# Patient Record
Sex: Female | Born: 1990 | Hispanic: Yes | Marital: Married | Smoking: Never smoker
Health system: Southern US, Community
[De-identification: ages and names within clinical notes are randomized; demographics above are authoritative.]

## PROBLEM LIST (undated history)

## (undated) ENCOUNTER — Inpatient Hospital Stay (HOSPITAL_COMMUNITY): Payer: Self-pay

## (undated) DIAGNOSIS — O139 Gestational [pregnancy-induced] hypertension without significant proteinuria, unspecified trimester: Secondary | ICD-10-CM

## (undated) DIAGNOSIS — D649 Anemia, unspecified: Secondary | ICD-10-CM

## (undated) DIAGNOSIS — N390 Urinary tract infection, site not specified: Secondary | ICD-10-CM

## (undated) HISTORY — PX: NO PAST SURGERIES: SHX2092

## (undated) HISTORY — DX: Anemia, unspecified: D64.9

---

## 2012-04-19 LAB — OB RESULTS CONSOLE ABO/RH: RH Type: POSITIVE

## 2012-04-19 LAB — OB RESULTS CONSOLE RPR: RPR: NONREACTIVE

## 2012-04-19 LAB — OB RESULTS CONSOLE RUBELLA ANTIBODY, IGM: Rubella: UNDETERMINED

## 2012-04-19 LAB — OB RESULTS CONSOLE HIV ANTIBODY (ROUTINE TESTING): HIV: NONREACTIVE

## 2012-04-19 LAB — OB RESULTS CONSOLE HEPATITIS B SURFACE ANTIGEN: Hepatitis B Surface Ag: NEGATIVE

## 2012-05-30 NOTE — L&D Delivery Note (Signed)
Delivery Note  SVD viable female Apgars 9,9 over 2nd degree ML lac.  Placenta delivered spontaneously intact with 3VC. Repair with 2-0 Chromic with good support and hemostasis noted and R/V exam confirms.  PH art was sent.  [place] cord blood was not done.  Mother and baby were doing well.  EBL 300cc  Bron Snellings, MD 

## 2012-12-13 ENCOUNTER — Inpatient Hospital Stay (HOSPITAL_COMMUNITY)
Admission: AD | Admit: 2012-12-13 | Discharge: 2012-12-13 | Disposition: A | Discharge status: Home / Self Care | Payer: Managed Care, Other (non HMO) | Source: Ambulatory Visit | Attending: Obstetrics and Gynecology | Admitting: Obstetrics and Gynecology

## 2012-12-13 ENCOUNTER — Inpatient Hospital Stay (HOSPITAL_COMMUNITY): Payer: Managed Care, Other (non HMO) | Admitting: Anesthesiology

## 2012-12-13 ENCOUNTER — Encounter (HOSPITAL_COMMUNITY): Payer: Self-pay | Admitting: *Deleted

## 2012-12-13 ENCOUNTER — Encounter (HOSPITAL_COMMUNITY): Payer: Self-pay | Admitting: Anesthesiology

## 2012-12-13 ENCOUNTER — Encounter (HOSPITAL_COMMUNITY): Payer: Self-pay

## 2012-12-13 ENCOUNTER — Inpatient Hospital Stay (HOSPITAL_COMMUNITY)
Admission: AD | Admit: 2012-12-13 | Discharge: 2012-12-15 | DRG: 775 | Disposition: A | Discharge status: Home / Self Care | Payer: Managed Care, Other (non HMO) | Source: Ambulatory Visit | Attending: Obstetrics and Gynecology | Admitting: Obstetrics and Gynecology

## 2012-12-13 DIAGNOSIS — O479 False labor, unspecified: Secondary | ICD-10-CM | POA: Insufficient documentation

## 2012-12-13 DIAGNOSIS — O4290 Premature rupture of membranes, unspecified as to length of time between rupture and onset of labor, unspecified weeks of gestation: Principal | ICD-10-CM | POA: Diagnosis present

## 2012-12-13 LAB — CBC
Hemoglobin: 12.1 g/dL (ref 12.0–15.0)
MCH: 29.8 pg (ref 26.0–34.0)
MCHC: 33.9 g/dL (ref 30.0–36.0)

## 2012-12-13 LAB — TYPE AND SCREEN
ABO/RH(D): O POS
Antibody Screen: NEGATIVE

## 2012-12-13 LAB — RPR: RPR Ser Ql: NONREACTIVE

## 2012-12-13 MED ORDER — LACTATED RINGERS IV SOLN
INTRAVENOUS | Status: DC
  Administered 2012-12-13: 125 mL/h via INTRAVENOUS

## 2012-12-13 MED ORDER — ZOLPIDEM TARTRATE 5 MG PO TABS: 5.0000 mg | ORAL_TABLET | Freq: Every evening | ORAL | Status: DC | PRN

## 2012-12-13 MED ORDER — CITRIC ACID-SODIUM CITRATE 334-500 MG/5ML PO SOLN: 30.0000 mL | ORAL | Status: DC | PRN

## 2012-12-13 MED ORDER — OXYTOCIN 40 UNITS IN LACTATED RINGERS INFUSION - SIMPLE MED
1.0000 m[IU]/min | INTRAVENOUS | Status: DC
  Administered 2012-12-13: 2 m[IU]/min via INTRAVENOUS
  Filled 2012-12-13: qty 1000

## 2012-12-13 MED ORDER — ACETAMINOPHEN 325 MG PO TABS: 650.0000 mg | ORAL_TABLET | ORAL | Status: DC | PRN

## 2012-12-13 MED ORDER — PRENATAL MULTIVITAMIN CH
1.0000 | ORAL_TABLET | Freq: Every day | ORAL | Status: DC
  Administered 2012-12-14: 1 via ORAL

## 2012-12-13 MED ORDER — ONDANSETRON HCL 4 MG/2ML IJ SOLN: 4.0000 mg | INTRAMUSCULAR | Status: DC | PRN

## 2012-12-13 MED ORDER — PHENYLEPHRINE 40 MCG/ML (10ML) SYRINGE FOR IV PUSH (FOR BLOOD PRESSURE SUPPORT)
80.0000 ug | PREFILLED_SYRINGE | INTRAVENOUS | Status: DC | PRN
  Filled 2012-12-13: qty 2
  Filled 2012-12-13: qty 5

## 2012-12-13 MED ORDER — TETANUS-DIPHTH-ACELL PERTUSSIS 5-2.5-18.5 LF-MCG/0.5 IM SUSP
0.5000 mL | Freq: Once | INTRAMUSCULAR | Status: AC
  Administered 2012-12-14: 0.5 mL via INTRAMUSCULAR
  Filled 2012-12-13: qty 0.5

## 2012-12-13 MED ORDER — LANOLIN HYDROUS EX OINT: TOPICAL_OINTMENT | CUTANEOUS | Status: DC | PRN

## 2012-12-13 MED ORDER — EPHEDRINE 5 MG/ML INJ
10.0000 mg | INTRAVENOUS | Status: DC | PRN
  Filled 2012-12-13: qty 2

## 2012-12-13 MED ORDER — IBUPROFEN 600 MG PO TABS: 600.0000 mg | ORAL_TABLET | Freq: Four times a day (QID) | ORAL | Status: DC | PRN

## 2012-12-13 MED ORDER — DIPHENHYDRAMINE HCL 50 MG/ML IJ SOLN: 12.5000 mg | INTRAMUSCULAR | Status: DC | PRN

## 2012-12-13 MED ORDER — MEASLES, MUMPS & RUBELLA VAC ~~LOC~~ INJ
0.5000 mL | INJECTION | Freq: Once | SUBCUTANEOUS | Status: AC
  Administered 2012-12-15: 0.5 mL via SUBCUTANEOUS
  Filled 2012-12-13: qty 0.5

## 2012-12-13 MED ORDER — LIDOCAINE HCL (PF) 1 % IJ SOLN
INTRAMUSCULAR | Status: DC | PRN
  Administered 2012-12-13: 9 mL

## 2012-12-13 MED ORDER — ONDANSETRON HCL 4 MG PO TABS: 4.0000 mg | ORAL_TABLET | ORAL | Status: DC | PRN

## 2012-12-13 MED ORDER — PRENATAL MULTIVITAMIN CH
1.0000 | ORAL_TABLET | Freq: Every day | ORAL | Status: DC
  Administered 2012-12-15: 1 via ORAL
  Filled 2012-12-13 (×2): qty 1

## 2012-12-13 MED ORDER — OXYCODONE-ACETAMINOPHEN 5-325 MG PO TABS: 1.0000 | ORAL_TABLET | ORAL | Status: DC | PRN

## 2012-12-13 MED ORDER — DIBUCAINE 1 % RE OINT: 1.0000 "application " | TOPICAL_OINTMENT | RECTAL | Status: DC | PRN

## 2012-12-13 MED ORDER — DIPHENHYDRAMINE HCL 25 MG PO CAPS: 25.0000 mg | ORAL_CAPSULE | Freq: Four times a day (QID) | ORAL | Status: DC | PRN

## 2012-12-13 MED ORDER — LACTATED RINGERS IV SOLN: 500.0000 mL | INTRAVENOUS | Status: DC | PRN

## 2012-12-13 MED ORDER — IBUPROFEN 600 MG PO TABS
600.0000 mg | ORAL_TABLET | Freq: Four times a day (QID) | ORAL | Status: DC
  Administered 2012-12-13 – 2012-12-15 (×7): 600 mg via ORAL
  Filled 2012-12-13 (×7): qty 1

## 2012-12-13 MED ORDER — OXYTOCIN 40 UNITS IN LACTATED RINGERS INFUSION - SIMPLE MED: 62.5000 mL/h | INTRAVENOUS | Status: DC

## 2012-12-13 MED ORDER — ONDANSETRON HCL 4 MG/2ML IJ SOLN: 4.0000 mg | Freq: Four times a day (QID) | INTRAMUSCULAR | Status: DC | PRN

## 2012-12-13 MED ORDER — SIMETHICONE 80 MG PO CHEW: 80.0000 mg | CHEWABLE_TABLET | ORAL | Status: DC | PRN

## 2012-12-13 MED ORDER — SENNOSIDES-DOCUSATE SODIUM 8.6-50 MG PO TABS
2.0000 | ORAL_TABLET | Freq: Every day | ORAL | Status: DC
  Administered 2012-12-13 – 2012-12-14 (×3): 2 via ORAL

## 2012-12-13 MED ORDER — WITCH HAZEL-GLYCERIN EX PADS: 1.0000 "application " | MEDICATED_PAD | CUTANEOUS | Status: DC | PRN

## 2012-12-13 MED ORDER — EPHEDRINE 5 MG/ML INJ
10.0000 mg | INTRAVENOUS | Status: DC | PRN
  Filled 2012-12-13: qty 2
  Filled 2012-12-13: qty 4

## 2012-12-13 MED ORDER — TERBUTALINE SULFATE 1 MG/ML IJ SOLN: 0.2500 mg | Freq: Once | INTRAMUSCULAR | Status: DC | PRN

## 2012-12-13 MED ORDER — BENZOCAINE-MENTHOL 20-0.5 % EX AERO
1.0000 "application " | INHALATION_SPRAY | CUTANEOUS | Status: DC | PRN
  Administered 2012-12-13: 1 via TOPICAL
  Filled 2012-12-13: qty 56

## 2012-12-13 MED ORDER — FENTANYL 2.5 MCG/ML BUPIVACAINE 1/10 % EPIDURAL INFUSION (WH - ANES)
14.0000 mL/h | INTRAMUSCULAR | Status: DC | PRN
  Filled 2012-12-13: qty 125

## 2012-12-13 MED ORDER — OXYTOCIN BOLUS FROM INFUSION: 500.0000 mL | INTRAVENOUS | Status: DC

## 2012-12-13 MED ORDER — PHENYLEPHRINE 40 MCG/ML (10ML) SYRINGE FOR IV PUSH (FOR BLOOD PRESSURE SUPPORT)
80.0000 ug | PREFILLED_SYRINGE | INTRAVENOUS | Status: DC | PRN
  Filled 2012-12-13: qty 2

## 2012-12-13 MED ORDER — LACTATED RINGERS IV SOLN
500.0000 mL | Freq: Once | INTRAVENOUS | Status: AC
  Administered 2012-12-13: 500 mL via INTRAVENOUS

## 2012-12-13 MED ORDER — LIDOCAINE HCL (PF) 1 % IJ SOLN
30.0000 mL | INTRAMUSCULAR | Status: DC | PRN
  Filled 2012-12-13 (×2): qty 30

## 2012-12-13 MED ORDER — SODIUM CHLORIDE 0.9 % IV SOLN
2.0000 g | Freq: Four times a day (QID) | INTRAVENOUS | Status: DC
  Administered 2012-12-13: 2 g via INTRAVENOUS
  Filled 2012-12-13 (×3): qty 2000

## 2012-12-13 MED ORDER — MEDROXYPROGESTERONE ACETATE 150 MG/ML IM SUSP: 150.0000 mg | INTRAMUSCULAR | Status: DC | PRN

## 2012-12-13 MED ORDER — FENTANYL 2.5 MCG/ML BUPIVACAINE 1/10 % EPIDURAL INFUSION (WH - ANES)
INTRAMUSCULAR | Status: DC | PRN
  Administered 2012-12-13: 14 mL/h via EPIDURAL

## 2012-12-13 NOTE — Anesthesia Preprocedure Evaluation (Signed)
Anesthesia Evaluation  Patient identified by MRN, date of birth, ID band Patient awake    Reviewed: Allergy & Precautions, H&P , NPO status , Patient's Chart, lab work & pertinent test results  Airway Mallampati: II TM Distance: >3 FB Neck ROM: full    Dental no notable dental hx.    Pulmonary neg pulmonary ROS,    Pulmonary exam normal       Cardiovascular negative cardio ROS      Neuro/Psych negative neurological ROS  negative psych ROS   GI/Hepatic negative GI ROS, Neg liver ROS,   Endo/Other  Morbid obesity  Renal/GU negative Renal ROS  negative genitourinary   Musculoskeletal   Abdominal (+) + obese,   Peds  Hematology negative hematology ROS (+)   Anesthesia Other Findings   Reproductive/Obstetrics (+) Pregnancy                          Anesthesia Physical Anesthesia Plan  ASA: III  Anesthesia Plan: Epidural   Post-op Pain Management:    Induction:   Airway Management Planned:   Additional Equipment:   Intra-op Plan:   Post-operative Plan:   Informed Consent: I have reviewed the patients History and Physical, chart, labs and discussed the procedure including the risks, benefits and alternatives for the proposed anesthesia with the patient or authorized representative who has indicated his/her understanding and acceptance.     Plan Discussed with:   Anesthesia Plan Comments:        Anesthesia Quick Evaluation  

## 2012-12-13 NOTE — MAU Note (Signed)
Per Dr Renaldo Fiddler recheck pt in one hour. If pt has not changed, pt can be d/c home.

## 2012-12-13 NOTE — H&P (Signed)
Daisy Marquez is a 22 y.o. female presenting for labor sxs and low AFI.  Was seen in MAU this morning and sent home.  Came to office with ctxs q 4-5 minutes for scheduled [place].  US showed AFI less than 3% (AFI 1).  Pt does not give hx c/w SROM.  GBS -. History OB History   Grav Para Term Preterm Abortions TAB SAB Ect Mult Living   1 0 0 0 0 0 0 0 0 0      Past Medical History  Diagnosis Date  . Medical history non-contributory    Past Surgical History  Procedure Laterality Date  . No past surgeries     Family History: family history is not on file. Social History:  reports that she has never smoked. She does not have any smokeless tobacco history on file. She reports that she does not drink alcohol or use illicit drugs.   Prenatal Transfer Tool  Maternal Diabetes: No Genetic Screening: Normal Maternal Ultrasounds/Referrals: Normal Fetal Ultrasounds or other Referrals:  None Maternal Substance Abuse:  No Significant Maternal Medications:  None Significant Maternal Lab Results:  None Other Comments:  None  ROS  Dilation: 6 Effacement (%): 100 Station: -2 Exam by:: Dr. Rana Snare Blood pressure 119/50, pulse 79, temperature 98.3 F (36.8 C), temperature source Oral, resp. rate 18, height 5\' 6"  (1.676 m), weight 102.967 kg (227 lb), SpO2 100.00%. Exam Physical Exam  Prenatal labs: ABO, Rh: --/--/O POS, O POS (07/17 1315) Antibody: NEG (07/17 1315) Rubella: Equivocal (11/21 0000) RPR: NON REACTIVE (07/17 1335)  HBsAg: Negative (11/21 0000)  HIV: Non-reactive (11/21 0000)  GBS: Negative (07/16 0000)   Assessment/Plan: IUP at term in active labor with ?SROM v low AFI IV Abx for ?PPROM.  Pitocin and anticipate SVD   Yadriel Kerrigan C 12/13/2012, 5:33 PM

## 2012-12-13 NOTE — MAU Note (Signed)
Pt states uc started around 0300 and are 1-2 mins apart. Denies vaginal bleeding and LOF. States good FM.

## 2012-12-14 LAB — CBC
Hemoglobin: 10.2 g/dL — ABNORMAL LOW (ref 12.0–15.0)
MCH: 29.4 pg (ref 26.0–34.0)
RBC: 3.47 MIL/uL — ABNORMAL LOW (ref 3.87–5.11)

## 2012-12-14 NOTE — Anesthesia Postprocedure Evaluation (Signed)
  Anesthesia Post-op Note  Patient: Daisy Marquez  Procedure(s) Performed: * No procedures listed *  Patient Location: PACU and Mother/Baby  Anesthesia Type:Epidural  Level of Consciousness: awake, alert  and oriented  Airway and Oxygen Therapy: Patient Spontanous Breathing  Post-op Pain: none  Post-op Assessment: Post-op Vital signs reviewed, Patient's Cardiovascular Status Stable, No headache, No backache, No residual numbness and No residual motor weakness  Post-op Vital Signs: Reviewed and stable  Complications: No apparent anesthesia complications

## 2012-12-14 NOTE — Lactation Note (Addendum)
This note was copied from the chart of Daisy Cherly Shirk. Lactation Consultation Note: Mom's choice to breast and formula feed infant as noted in chart on admission on 12/13/12 at 1311  Patient Name: Daisy Marquez [id number] Date: 12/14/2012 Reason for consult: Initial assessment   Maternal Data Formula Feeding for Exclusion: Yes Reason for exclusion: Mother's choice to formula and breast feed on admission Infant to breast within first hour of birth: Yes (attempt) Has patient been taught Hand Expression?: Yes Does the patient have breastfeeding experience prior to this delivery?: No  Feeding Feeding Type: Breast Milk Feeding method: Breast Length of feed: 15 min  LATCH Score/Interventions Latch: Grasps breast easily, tongue down, lips flanged, rhythmical sucking. (with NS) Intervention(s): Skin to skin;Teach feeding cues Intervention(s): Breast compression;Breast massage  Audible Swallowing: None  Type of Nipple: Flat  Comfort (Breast/Nipple): Soft / non-tender     Hold (Positioning): Assistance needed to correctly position infant at breast and maintain latch. Intervention(s): Breastfeeding basics reviewed;Support Pillows;Position options;Skin to skin  LATCH Score: 6  Lactation Tools Discussed/Used Tools: Nipple Shields Nipple shield size: 20   Consult Status Consult Status: Follow-up Date: 12/15/12 Follow-up type: In-patient  Mom reports that baby has not fed since 4:30am. Baby waking, attempted to latch him. He would not latch to breast. Nipples flat and mom reports they are very tender. Breasts appear small and slightly wide spaced. Reports that she had breast changes during pregnancy- they got tender and darker. Used manual pump to erect nipple. Mom reports that pumping hurts. Tried #27 flange and mom reports that feels better. Used # 20 NS and baby latched and nursed for 15 minutes,. Mom reports this is the best he has done. Baby placed skin to skin with mom. He  was a little fussy and mom asking about formula. Encouraged to breast feed frequently to promote a good milk supply. Baby off to sleep. BF brochure given with resources for support after DC. No questions at present. To call for assist prn.  Pamelia Hoit 12/14/2012, 11:21 AM

## 2012-12-14 NOTE — Progress Notes (Signed)
Post Partum Day 1 Subjective: no complaints, voiding and tolerating PO  Objective: Blood pressure 130/76, pulse 80, temperature 98.1 F (36.7 C), temperature source Oral, resp. rate 18, height 5\' 6"  (1.676 m), weight 227 lb (102.967 kg), SpO2 100.00%, unknown if currently breastfeeding.  Physical Exam:  General: alert, cooperative and no distress Lochia: appropriate Uterine Fundus: firm Incision: healing well DVT Evaluation: No evidence of DVT seen on physical exam.   Recent Labs  12/13/12 1335 12/14/12 0612  HGB 12.1 10.2*  HCT 35.7* 30.8*    Assessment/Plan: Plan for discharge tomorrow   LOS: 1 day   Daisy Marquez,Daisy Marquez 12/14/2012, 8:48 AM

## 2012-12-15 ENCOUNTER — Encounter (HOSPITAL_COMMUNITY)
Admission: RE | Admit: 2012-12-15 | Discharge: 2012-12-15 | Disposition: A | Discharge status: Home / Self Care | Payer: Managed Care, Other (non HMO) | Source: Ambulatory Visit | Attending: Obstetrics and Gynecology | Admitting: Obstetrics and Gynecology

## 2012-12-15 DIAGNOSIS — O923 Agalactia: Secondary | ICD-10-CM | POA: Insufficient documentation

## 2012-12-15 MED ORDER — BENZOCAINE-MENTHOL 20-0.5 % EX AERO: 1.0000 "application " | INHALATION_SPRAY | CUTANEOUS | Status: DC | PRN

## 2012-12-15 MED ORDER — OXYCODONE-ACETAMINOPHEN 5-325 MG PO TABS: 1.0000 | ORAL_TABLET | Freq: Four times a day (QID) | ORAL | Status: DC | PRN

## 2012-12-15 MED ORDER — IBUPROFEN 600 MG PO TABS: 600.0000 mg | ORAL_TABLET | Freq: Four times a day (QID) | ORAL | Status: DC | PRN

## 2012-12-15 NOTE — Discharge Summary (Signed)
Obstetric Discharge Summary Reason for Admission: onset of labor Prenatal Procedures: ultrasound Intrapartum Procedures: spontaneous vaginal delivery Postpartum Procedures: none Complications-Operative and Postpartum: none Hemoglobin  Date Value Range Status  12/14/2012 10.2* 12.0 - 15.0 g/dL Final     HCT  Date Value Range Status  12/14/2012 30.8* 36.0 - 46.0 % Final    Physical Exam:  General: alert, cooperative and no distress Lochia: appropriate Uterine Fundus: firm Incision: healing well DVT Evaluation: No evidence of DVT seen on physical exam.  Discharge Diagnoses: Term Pregnancy-delivered  Discharge Information: Date: 12/15/2012 Activity: pelvic rest Diet: routine Medications: PNV, Ibuprofen and Percocet Condition: stable Instructions: refer to practice specific booklet Discharge to: home   Newborn Data: Live born female  Birth Weight: 7 lb (3175 g) APGAR: 9, 9  Home with mother.  Jordyn Doane II,Nusaiba Guallpa E 12/15/2012, 9:45 AM

## 2012-12-19 ENCOUNTER — Ambulatory Visit (HOSPITAL_COMMUNITY): Payer: Managed Care, Other (non HMO)

## 2013-01-15 ENCOUNTER — Encounter (HOSPITAL_COMMUNITY)
Admission: RE | Admit: 2013-01-15 | Discharge: 2013-01-15 | Disposition: A | Discharge status: Home / Self Care | Payer: Managed Care, Other (non HMO) | Source: Ambulatory Visit | Attending: Obstetrics and Gynecology | Admitting: Obstetrics and Gynecology

## 2013-01-15 DIAGNOSIS — O923 Agalactia: Secondary | ICD-10-CM | POA: Insufficient documentation

## 2013-05-30 NOTE — L&D Delivery Note (Signed)
Delivery Note At 7:42 PM a viable female was delivered via Vaginal, Spontaneous Delivery (Presentation: ;  ).  APGAR: 9, 9; weight .   Placenta status: Intact, Spontaneous.  Cord: 3 vessels with the following complications: None.  Cord pH: not done  Anesthesia: Epidural  Episiotomy:  Lacerations: 2nd degree Suture Repair: 2.0 vicryl Est. Blood Loss (mL):   Mom to postpartum.  Baby to Couplet care / Skin to Skin.  Saidi Santacroce A 01/22/2014, 7:51 PM

## 2013-07-04 LAB — OB RESULTS CONSOLE HIV ANTIBODY (ROUTINE TESTING): HIV: NONREACTIVE

## 2013-07-04 LAB — OB RESULTS CONSOLE ANTIBODY SCREEN: Antibody Screen: NEGATIVE

## 2013-07-04 LAB — OB RESULTS CONSOLE RPR: RPR: NONREACTIVE

## 2013-07-04 LAB — OB RESULTS CONSOLE GC/CHLAMYDIA
Chlamydia: NEGATIVE
Gonorrhea: NEGATIVE

## 2013-07-04 LAB — OB RESULTS CONSOLE HEPATITIS B SURFACE ANTIGEN: HEP B S AG: NEGATIVE

## 2013-07-04 LAB — OB RESULTS CONSOLE ABO/RH: RH TYPE: POSITIVE

## 2013-07-04 LAB — OB RESULTS CONSOLE RUBELLA ANTIBODY, IGM: Rubella: IMMUNE

## 2013-11-15 LAB — OB RESULTS CONSOLE GBS: GBS: NEGATIVE

## 2014-01-04 ENCOUNTER — Emergency Department (HOSPITAL_COMMUNITY)
Admission: EM | Admit: 2014-01-04 | Discharge: 2014-01-04 | Disposition: A | Discharge status: Home / Self Care | Payer: Medicaid Other | Attending: Emergency Medicine | Admitting: Emergency Medicine

## 2014-01-04 ENCOUNTER — Encounter (HOSPITAL_COMMUNITY): Payer: Self-pay | Admitting: Emergency Medicine

## 2014-01-04 DIAGNOSIS — O9989 Other specified diseases and conditions complicating pregnancy, childbirth and the puerperium: Secondary | ICD-10-CM | POA: Diagnosis not present

## 2014-01-04 DIAGNOSIS — O2390 Unspecified genitourinary tract infection in pregnancy, unspecified trimester: Secondary | ICD-10-CM | POA: Diagnosis present

## 2014-01-04 DIAGNOSIS — O2343 Unspecified infection of urinary tract in pregnancy, third trimester: Secondary | ICD-10-CM

## 2014-01-04 DIAGNOSIS — N390 Urinary tract infection, site not specified: Secondary | ICD-10-CM | POA: Insufficient documentation

## 2014-01-04 DIAGNOSIS — R209 Unspecified disturbances of skin sensation: Secondary | ICD-10-CM | POA: Insufficient documentation

## 2014-01-04 DIAGNOSIS — R42 Dizziness and giddiness: Secondary | ICD-10-CM | POA: Insufficient documentation

## 2014-01-04 DIAGNOSIS — R0602 Shortness of breath: Secondary | ICD-10-CM | POA: Insufficient documentation

## 2014-01-04 LAB — URINALYSIS, ROUTINE W REFLEX MICROSCOPIC
Bilirubin Urine: NEGATIVE
GLUCOSE, UA: NEGATIVE mg/dL
Hgb urine dipstick: NEGATIVE
KETONES UR: NEGATIVE mg/dL
NITRITE: NEGATIVE
PH: 6 (ref 5.0–8.0)
Protein, ur: NEGATIVE mg/dL
SPECIFIC GRAVITY, URINE: 1.015 (ref 1.005–1.030)
Urobilinogen, UA: 0.2 mg/dL (ref 0.0–1.0)

## 2014-01-04 LAB — COMPREHENSIVE METABOLIC PANEL
ALBUMIN: 2.4 g/dL — AB (ref 3.5–5.2)
ALT: 8 U/L (ref 0–35)
ANION GAP: 14 (ref 5–15)
AST: 14 U/L (ref 0–37)
Alkaline Phosphatase: 138 U/L — ABNORMAL HIGH (ref 39–117)
BUN: 8 mg/dL (ref 6–23)
CALCIUM: 8.5 mg/dL (ref 8.4–10.5)
CO2: 17 mEq/L — ABNORMAL LOW (ref 19–32)
CREATININE: 0.47 mg/dL — AB (ref 0.50–1.10)
Chloride: 106 mEq/L (ref 96–112)
GFR calc Af Amer: >90 mL/min (ref >90)
GFR calc non Af Amer: >90 mL/min (ref >90)
Glucose, Bld: 81 mg/dL (ref 70–99)
Potassium: 4 mEq/L (ref 3.7–5.3)
Sodium: 137 mEq/L (ref 137–147)
TOTAL PROTEIN: 6.5 g/dL (ref 6.0–8.3)
Total Bilirubin: 0.2 mg/dL — ABNORMAL LOW (ref 0.3–1.2)

## 2014-01-04 LAB — CBC WITH DIFFERENTIAL/PLATELET
BASOS ABS: 0 10*3/uL (ref 0.0–0.1)
BASOS PCT: 0 % (ref 0–1)
EOS ABS: 0.2 10*3/uL (ref 0.0–0.7)
Eosinophils Relative: 3 % (ref 0–5)
HEMATOCRIT: 34.2 % — AB (ref 36.0–46.0)
HEMOGLOBIN: 10.7 g/dL — AB (ref 12.0–15.0)
Lymphocytes Relative: 21 % (ref 12–46)
Lymphs Abs: 1.7 10*3/uL (ref 0.7–4.0)
MCH: 26 pg (ref 26.0–34.0)
MCHC: 31.3 g/dL (ref 30.0–36.0)
MCV: 83.2 fL (ref 78.0–100.0)
MONO ABS: 0.8 10*3/uL (ref 0.1–1.0)
MONOS PCT: 9 % (ref 3–12)
Neutro Abs: 5.6 10*3/uL (ref 1.7–7.7)
Neutrophils Relative %: 67 % (ref 43–77)
Platelets: 251 10*3/uL (ref 150–400)
RBC: 4.11 MIL/uL (ref 3.87–5.11)
RDW: 14.6 % (ref 11.5–15.5)
WBC: 8.3 10*3/uL (ref 4.0–10.5)

## 2014-01-04 LAB — URINE MICROSCOPIC-ADD ON

## 2014-01-04 LAB — I-STAT CHEM 8, ED
BUN: 5 mg/dL — ABNORMAL LOW (ref 6–23)
CALCIUM ION: 1.17 mmol/L (ref 1.12–1.23)
CHLORIDE: 109 meq/L (ref 96–112)
Creatinine, Ser: 0.4 mg/dL — ABNORMAL LOW (ref 0.50–1.10)
Glucose, Bld: 71 mg/dL (ref 70–99)
HEMATOCRIT: 32 % — AB (ref 36.0–46.0)
Hemoglobin: 10.9 g/dL — ABNORMAL LOW (ref 12.0–15.0)
POTASSIUM: 4.5 meq/L (ref 3.7–5.3)
SODIUM: 139 meq/L (ref 137–147)
TCO2: 17 mmol/L (ref 0–100)

## 2014-01-04 LAB — CBG MONITORING, ED: Glucose-Capillary: 88 mg/dL (ref 70–99)

## 2014-01-04 MED ORDER — SODIUM CHLORIDE 0.9 % IV BOLUS (SEPSIS)
1000.0000 mL | Freq: Once | INTRAVENOUS | Status: AC
  Administered 2014-01-04: 1000 mL via INTRAVENOUS

## 2014-01-04 MED ORDER — NITROFURANTOIN MONOHYD MACRO 100 MG PO CAPS: 100.0000 mg | ORAL_CAPSULE | Freq: Two times a day (BID) | ORAL | Status: DC

## 2014-01-04 NOTE — ED Notes (Signed)
OB rapid response at bedside. Pt placed on monitor and fht 140's

## 2014-01-04 NOTE — Discharge Instructions (Signed)

## 2014-01-04 NOTE — Progress Notes (Addendum)
Pt c/o of dizziness. No spots before her eyes, blurred vision or epigastric pain. BP 133/81. C/o slight headache at this time,but says she has had no problems with this pregnancy. Denies vaginal bleeding or leaking of fluid. Says she was [place] [employer] today when she felt dizzy and almost lost her balance. Says she sat back down in the car. EFM applied at 1738 by the ED staff. FHR 140 BPM. FHR at this time is 140 BPM. Category 1 tracing. Moderate variability, 15x15 accels, no decels. Reflexes 1 plus no clonus. Pt is G2P1. Previous vaginal delivery. 373/7 weeks.

## 2014-01-04 NOTE — Progress Notes (Signed)
Spoke with Dr. Tamela OddiJackson-Moore. Pt is G2P1 at 373/[redacted] weeks pregnant c/o dizziness today. BP 133/81. Reflexes 1 plus no clonus. No blurred vision or spots before her eyes. FHR is a category 1 tracing. Moderate variability, 15x15 accels, no decels, baseline 135- 140 BPM. 2 uc's. No vaginal bleeding. No leaking of fluid. Labs to be drawn are CBC, CMP. IV fluids will be started. Okay for OB to sign off. Okay for pt to be d/c when ED staff is finished.

## 2014-01-04 NOTE — ED Notes (Signed)
The rapid response ob rn has been called

## 2014-01-04 NOTE — ED Notes (Signed)
Pt states near syncopal episode this afternoon, states she was shopping and felt like she was going to pass out. Pt also reports she became anxious and sweaty. Upon arrival she is alert and oriented x4. States she feels much better at present. No neurological deficits noted. States she is x9 months pregnant.

## 2014-01-04 NOTE — Progress Notes (Signed)
Dr. Silverio LayYao, [facility] MD in to see pt. I will monitor pt for 30 min and then call Dr. Tamela OddiJackson-Moore, attending OB on call. FHR reactive, Category 1 tracing, 2 uc's, vag bleeding or leaking of fluid.

## 2014-01-04 NOTE — Progress Notes (Signed)
Spoke with Dr. Silverio LayYao. Told him that dr. Tamela OddiJackson-Moore says it is okay for OB to sign off. FHR category 1 tracing. Blood pressures are not concerning.

## 2014-01-04 NOTE — ED Notes (Signed)
The pt is 9 months preg.  Her edc is in 2 weeks.  Normal pregnancy.  She was shopping and felt like she was going to faint while she was walking to her car.  Her last meal was  At 1300 today.  She felt jittery  And nervous.  She is feeling better  At present no and or back pain no vaginal bleeding.  Alert skin warm and dry.  She called her brother to  Drive her here..  She has one other child at home.  No history of panic or anxiety attacks

## 2014-01-04 NOTE — ED Provider Notes (Signed)
CSN: [id number]     Arrival date & time 01/04/14  1705 History   First MD Initiated Contact with Patient 01/04/14 1730     Chief Complaint  Patient presents with  . Dizziness   Daisy Marquez is a 23 year old Hispanic female who is a G2P1, and is [redacted] weeks pregnant who presents today after an episode of presyncope/sycnope. Patient was walking in [employer] earlier today and felt tired, dizzy, diaphoretic, and a little bit short of breath. She went to her car and sat down for a couple seconds. She thinks she may have passed out for 1-2 seconds. Patient began sipping on her soda and began to feel a little better but now has numbness in her posterior lower extremities. Noticed that baby is moving a lot more than normal today.  She denies RUQ pain, vaginal discharge/bleeding, CP, fever, chills, N/V, constipation/diarrhea, hematemesis, dysuria, hematuria, sick contacts, or recent travel. Has no complications this pregnancy.     (Consider location/radiation/quality/duration/timing/severity/associated sxs/prior Treatment) Patient is a 23 y.o. female presenting with near-syncope.  Near Syncope This is a new problem. The current episode started today. The problem has been gradually improving. Associated symptoms include diaphoresis, fatigue and numbness (back of legs). Pertinent negatives include no abdominal pain, chest pain, chills, fever, headaches, nausea, urinary symptoms, vertigo or vomiting.    Past Medical History  Diagnosis Date  . Medical history non-contributory    Past Surgical History  Procedure Laterality Date  . No past surgeries     No family history on file. History  Substance Use Topics  . Smoking status: Never Smoker   . Smokeless tobacco: Not on file  . Alcohol Use: No   OB History   Grav Para Term Preterm Abortions TAB SAB Ect Mult Living   1 1 1  0 0 0 0 0 0 1     Review of Systems  Constitutional: Positive for diaphoresis and fatigue. Negative for fever and chills.   Respiratory: Positive for shortness of breath.   Cardiovascular: Positive for near-syncope. Negative for chest pain, palpitations and leg swelling.  Gastrointestinal: Negative for nausea, vomiting, abdominal pain, diarrhea, constipation and abdominal distention.  Genitourinary: Negative for dysuria, frequency, flank pain and decreased urine volume.  Neurological: Positive for numbness (back of legs). Negative for dizziness, vertigo, speech difficulty, light-headedness and headaches.  All other systems reviewed and are negative.     Allergies  Review of patient's allergies indicates no known allergies.  Home Medications   Prior to Admission medications   Medication Sig Start Date End Date Taking? Authorizing Provider  Prenatal Vit-Fe Fumarate-FA (PRENATAL MULTIVITAMIN) TABS Take 1 tablet by mouth daily at 12 noon.   Yes Historical Provider, MD   BP 137/81  Pulse 88  Temp(Src) 98.8 F (37.1 C) (Oral)  Resp 18  SpO2 98% Physical Exam  Nursing note and vitals reviewed. Constitutional: She appears well-developed and well-nourished. No distress.  HENT:  Head: Normocephalic and atraumatic.  Cardiovascular: Normal rate, regular rhythm, normal heart sounds and intact distal pulses.  Exam reveals no gallop and no friction rub.   No murmur heard. Pulmonary/Chest: Effort normal and breath sounds normal. No respiratory distress. She has no wheezes. She has no rales. She exhibits no tenderness.  Abdominal: Soft. Bowel sounds are normal. She exhibits no distension and no mass. There is no tenderness. There is no rebound and no guarding.  Lymphadenopathy:    She has no cervical adenopathy.  Neurological:  Tingling to posterior LEs, but sensation still intact.  Left worse than right.   Skin: Skin is warm and dry. She is not diaphoretic.    ED Course  Procedures (including critical care time) Labs Review Labs Reviewed  CBC WITH DIFFERENTIAL - Abnormal; Notable for the following:     Hemoglobin 10.7 (*)    HCT 34.2 (*)    All other components within normal limits  COMPREHENSIVE METABOLIC PANEL - Abnormal; Notable for the following:    CO2 17 (*)    Creatinine, Ser 0.47 (*)    Albumin 2.4 (*)    Alkaline Phosphatase 138 (*)    Total Bilirubin 0.2 (*)    All other components within normal limits  URINALYSIS, ROUTINE W REFLEX MICROSCOPIC - Abnormal; Notable for the following:    APPearance CLOUDY (*)    Leukocytes, UA MODERATE (*)    All other components within normal limits  URINE MICROSCOPIC-ADD ON - Abnormal; Notable for the following:    Squamous Epithelial / LPF MANY (*)    Bacteria, UA MANY (*)    All other components within normal limits  I-STAT CHEM 8, ED - Abnormal; Notable for the following:    BUN 5 (*)    Creatinine, Ser 0.40 (*)    Hemoglobin 10.9 (*)    HCT 32.0 (*)    All other components within normal limits  CBG MONITORING, ED    Imaging Review No results found.   EKG Interpretation   Date/Time:  Saturday January 04 2014 18:13:28 EDT Ventricular Rate:  74 PR Interval:  129 QRS Duration: 97 QT Interval:  395 QTC Calculation: 438 R Axis:   87 Text Interpretation:  Sinus rhythm No previous ECGs available Confirmed by  YAO  MD, DAVID ([id number]) on 01/04/2014 7:03:21 PM      MDM   23 year old female, [redacted] weeks pregnant, with dizziness, fatigue, and questionable syncope. Please see history of present illness for details. On exam patient in NAD, AF VSS. No abdominal tenderness. No other symptoms at this time aside from lower extremity  tingling. No hypoglycemia. Will obtain CBC, CMP, UA, EKG, and orthostatics.  Negative orthostatics. Labs show non AG metabolic acidosis. Suspect due to dehydration.  2L NS bolus.   Fetal heart rate on monitor wnl in the 130s. OB NP (Ms. Early) also evaluated the pt. Found fetal tracing to be category 1, w/no signs of distress. Pt has no vaginal bleeding or other concerning sx. OB has signed off.   Pt no longer  feels dizzy. UA shows possible UTI. Given Rx for macrobid. Follow up to OB on Monday/next week. Strict return precautions include any presyncope/syncope, fevers, chills, contractions or vaginal bleeding.  Final diagnoses:  Dizziness  UTI in pregnancy, third trimester    Pt was seen under the supervision of Dr. Silverio LayYao.     Rachelle HoraKeri Ender Rorke, MD 01/05/14 [phone]

## 2014-01-05 NOTE — ED Provider Notes (Signed)
I saw and evaluated the patient, reviewed the resident's note and I agree with the findings and plan.   EKG Interpretation   Date/Time:  Saturday January 04 2014 18:13:28 EDT Ventricular Rate:  74 PR Interval:  129 QRS Duration: 97 QT Interval:  395 QTC Calculation: 438 R Axis:   87 Text Interpretation:  Sinus rhythm No previous ECGs available Confirmed by  Isaias Dowson  MD, Thompson Mckim ([id number]) on 01/04/2014 7:03:21 PM      Daisy Marquez is a 23 y.o. female 2238 week pregnant here with near syncope. Was at a store and felt light headed and dizzy. She may have drank less water today. She sat down and has no head injury. No cardiac problems in the past. No abdominal pain or vaginal bleeding or contractions. On exam, slightly dehydrated. Abdomen showed gravid uterus, nontender. EKG unremarkable. Labs showed bicarb 17, likely from dehydration. OB NP evaluated patient and fetal tracing assuring and called her OB and reassured patient. Given 2 L NS. UA ? UTI, given macrobid. Stable for d/c.    Richardean Canalavid H Demarkus Remmel, MD 01/05/14 [phone]

## 2014-01-12 ENCOUNTER — Encounter (HOSPITAL_COMMUNITY): Payer: Self-pay | Admitting: *Deleted

## 2014-01-12 ENCOUNTER — Inpatient Hospital Stay (EMERGENCY_DEPARTMENT_HOSPITAL)
Admission: AD | Admit: 2014-01-12 | Discharge: 2014-01-12 | Disposition: A | Discharge status: Home / Self Care | Payer: Medicaid Other | Source: Ambulatory Visit | Attending: Obstetrics | Admitting: Obstetrics

## 2014-01-12 ENCOUNTER — Inpatient Hospital Stay (HOSPITAL_COMMUNITY)
Admission: AD | Admit: 2014-01-12 | Discharge: 2014-01-12 | Disposition: A | Discharge status: Home / Self Care | Payer: Medicaid Other | Source: Ambulatory Visit | Attending: Obstetrics | Admitting: Obstetrics

## 2014-01-12 DIAGNOSIS — R5383 Other fatigue: Secondary | ICD-10-CM

## 2014-01-12 DIAGNOSIS — O479 False labor, unspecified: Secondary | ICD-10-CM | POA: Insufficient documentation

## 2014-01-12 DIAGNOSIS — O139 Gestational [pregnancy-induced] hypertension without significant proteinuria, unspecified trimester: Secondary | ICD-10-CM | POA: Insufficient documentation

## 2014-01-12 DIAGNOSIS — O10019 Pre-existing essential hypertension complicating pregnancy, unspecified trimester: Secondary | ICD-10-CM | POA: Diagnosis not present

## 2014-01-12 DIAGNOSIS — O2390 Unspecified genitourinary tract infection in pregnancy, unspecified trimester: Secondary | ICD-10-CM | POA: Insufficient documentation

## 2014-01-12 DIAGNOSIS — O163 Unspecified maternal hypertension, third trimester: Secondary | ICD-10-CM

## 2014-01-12 DIAGNOSIS — O99891 Other specified diseases and conditions complicating pregnancy: Secondary | ICD-10-CM | POA: Insufficient documentation

## 2014-01-12 DIAGNOSIS — R5381 Other malaise: Secondary | ICD-10-CM

## 2014-01-12 DIAGNOSIS — B373 Candidiasis of vulva and vagina: Secondary | ICD-10-CM

## 2014-01-12 DIAGNOSIS — O9989 Other specified diseases and conditions complicating pregnancy, childbirth and the puerperium: Secondary | ICD-10-CM

## 2014-01-12 DIAGNOSIS — B3731 Acute candidiasis of vulva and vagina: Secondary | ICD-10-CM

## 2014-01-12 DIAGNOSIS — O169 Unspecified maternal hypertension, unspecified trimester: Secondary | ICD-10-CM

## 2014-01-12 HISTORY — DX: Urinary tract infection, site not specified: N39.0

## 2014-01-12 LAB — COMPREHENSIVE METABOLIC PANEL
ALBUMIN: 2.4 g/dL — AB (ref 3.5–5.2)
ALK PHOS: 155 U/L — AB (ref 39–117)
ALT: 8 U/L (ref 0–35)
AST: 13 U/L (ref 0–37)
Anion gap: 10 (ref 5–15)
BUN: 10 mg/dL (ref 6–23)
CHLORIDE: 103 meq/L (ref 96–112)
CO2: 24 mEq/L (ref 19–32)
Calcium: 8.7 mg/dL (ref 8.4–10.5)
Creatinine, Ser: 0.58 mg/dL (ref 0.50–1.10)
GFR calc Af Amer: >90 mL/min (ref >90)
GFR calc non Af Amer: >90 mL/min (ref >90)
Glucose, Bld: 85 mg/dL (ref 70–99)
POTASSIUM: 4.6 meq/L (ref 3.7–5.3)
SODIUM: 137 meq/L (ref 137–147)
Total Protein: 6.3 g/dL (ref 6.0–8.3)

## 2014-01-12 LAB — WET PREP, GENITAL
TRICH WET PREP: NONE SEEN
Yeast Wet Prep HPF POC: NONE SEEN

## 2014-01-12 LAB — URINALYSIS, ROUTINE W REFLEX MICROSCOPIC
Bilirubin Urine: NEGATIVE
GLUCOSE, UA: NEGATIVE mg/dL
HGB URINE DIPSTICK: NEGATIVE
Ketones, ur: NEGATIVE mg/dL
Nitrite: NEGATIVE
Protein, ur: NEGATIVE mg/dL
Specific Gravity, Urine: <1.005 — ABNORMAL LOW (ref 1.005–1.030)
Urobilinogen, UA: 0.2 mg/dL (ref 0.0–1.0)
pH: 7 (ref 5.0–8.0)

## 2014-01-12 LAB — PROTEIN / CREATININE RATIO, URINE
CREATININE, URINE: 59.99 mg/dL
Protein Creatinine Ratio: 0.12 (ref 0.00–0.15)
TOTAL PROTEIN, URINE: 7.3 mg/dL

## 2014-01-12 LAB — URIC ACID: URIC ACID, SERUM: 5.4 mg/dL (ref 2.4–7.0)

## 2014-01-12 LAB — POCT FERN TEST: POCT Fern Test: NEGATIVE

## 2014-01-12 LAB — CBC
HCT: 34.8 % — ABNORMAL LOW (ref 36.0–46.0)
Hemoglobin: 11.1 g/dL — ABNORMAL LOW (ref 12.0–15.0)
MCH: 26.2 pg (ref 26.0–34.0)
MCHC: 31.9 g/dL (ref 30.0–36.0)
MCV: 82.1 fL (ref 78.0–100.0)
PLATELETS: 281 10*3/uL (ref 150–400)
RBC: 4.24 MIL/uL (ref 3.87–5.11)
RDW: 15.3 % (ref 11.5–15.5)
WBC: 12 10*3/uL — AB (ref 4.0–10.5)

## 2014-01-12 LAB — LACTATE DEHYDROGENASE: LDH: 188 U/L (ref 94–250)

## 2014-01-12 LAB — URINE MICROSCOPIC-ADD ON

## 2014-01-12 MED ORDER — FLUCONAZOLE 150 MG PO TABS
150.0000 mg | ORAL_TABLET | ORAL | Status: AC
  Administered 2014-01-12: 150 mg via ORAL
  Filled 2014-01-12: qty 1

## 2014-01-12 NOTE — Progress Notes (Signed)
Daisy Marquez

## 2014-01-12 NOTE — Discharge Instructions (Signed)

## 2014-01-12 NOTE — MAU Provider Note (Signed)
Chief Complaint:  No chief complaint on file.   None     HPI: Daisy Marquez is a 23 y.o. G2P1001 at [redacted]w[redacted]d pt of Dr Gaynell Face who presents to maternity admissions reporting leakage of fluid and mild contractions. She had one gush of fluid ~1:30 am, then has had wet underwear since then but has not required a pad.  She also reports vaginal itching x2-3 days. She is currently completing a course of Macrobid for UTI.  She denies h/a, epigastric pain, or visual disturbances.  She reports good fetal movement, denies vaginal bleeding, vaginal itching/burning, urinary symptoms,  dizziness, n/v, or fever/chills.     Past Medical History: Past Medical History  Diagnosis Date  . Medical history non-contributory   . UTI (lower urinary tract infection)     Past obstetric history: OB History  Gravida Para Term Preterm AB SAB TAB Ectopic Multiple Living  2 1 1  0 0 0 0 0 0 1    # Outcome Date GA Lbr Len/2nd Weight Sex Delivery Anes PTL Lv  2 CUR           1 TRM 12/13/12 [redacted]w[redacted]d 15:02 / 00:28 3.175 kg (7 lb) M SVD EPI  Y      Past Surgical History: Past Surgical History  Procedure Laterality Date  . No past surgeries      Family History: Family History  Problem Relation Age of Onset  . Alcohol abuse Neg Hx     Social History: History  Substance Use Topics  . Smoking status: Never Smoker   . Smokeless tobacco: Not on file  . Alcohol Use: No    Allergies: No Known Allergies  Meds:  Prescriptions prior to admission  Medication Sig Dispense Refill  . nitrofurantoin, macrocrystal-monohydrate, (MACROBID) 100 MG capsule Take 100 mg by mouth 2 (two) times daily.      . Prenatal Vit-Fe Fumarate-FA (PRENATAL MULTIVITAMIN) TABS Take 1 tablet by mouth daily at 12 noon.        ROS: Pertinent findings in history of present illness.  Physical Exam  Blood pressure 127/78, pulse 84, temperature 98 F (36.7 C), resp. rate 20, height 5\' 6"  (1.676 m), weight 103.692 kg (228 lb 9.6 oz), unknown if  currently breastfeeding. Patient Vitals for the past 24 hrs:  BP Temp Pulse Resp Height Weight  01/12/14 0538 127/78 mmHg - 84 - - -  01/12/14 0516 140/70 mmHg - 71 - - -  01/12/14 0510 93/67 mmHg - 83 - - -  01/12/14 0445 147/76 mmHg - 87 - - -  01/12/14 0443 155/73 mmHg - 71 - - -  01/12/14 0440 151/77 mmHg - 85 - - -  01/12/14 0429 141/65 mmHg - 85 - - -  01/12/14 0414 153/67 mmHg 98 F (36.7 C) 75 20 5\' 6"  (1.676 m) 103.692 kg (228 lb 9.6 oz)   GENERAL: Well-developed, well-nourished female in no acute distress.  HEENT: normocephalic HEART: normal rate RESP: normal effort ABDOMEN: Soft, non-tender, gravid appropriate for gestational age EXTREMITIES: Nontender, no edema NEURO: alert and oriented Pelvic exam: Cervix pink, visually closed, without lesion, small amount thick white discharge, vaginal walls and external genitalia with mild erythema with white discharge in external labia.  Pt has baby powder on external genitalia r/t itching and discomfort.  Dilation: 2 Effacement (%): 50 Cervical Position: Posterior Station: -3 Presentation: Vertex Exam by:: L. Leftwich-Kirby CNM Cervix unchanged from previous exam  FHT:  Baseline 135 , moderate variability, accelerations present, no decelerations Contractions:  occasional, mild to palpation   Labs: Results for orders placed during the hospital encounter of 01/12/14 (from the past 24 hour(s))  WET PREP, GENITAL     Status: Abnormal   Collection Time    01/12/14  4:50 AM      Result Value Ref Range   Yeast Wet Prep HPF POC NONE SEEN  NONE SEEN   Trich, Wet Prep NONE SEEN  NONE SEEN   Clue Cells Wet Prep HPF POC FEW (*) NONE SEEN   WBC, Wet Prep HPF POC MODERATE (*) NONE SEEN  PROTEIN / CREATININE RATIO, URINE     Status: None   Collection Time    01/12/14  5:00 AM      Result Value Ref Range   Creatinine, Urine 59.99     Total Protein, Urine 7.3     PROTEIN CREATININE RATIO 0.12  0.00 - 0.15  CBC     Status: Abnormal    Collection Time    01/12/14  5:04 AM      Result Value Ref Range   WBC 12.0 (*) 4.0 - 10.5 K/uL   RBC 4.24  3.87 - 5.11 MIL/uL   Hemoglobin 11.1 (*) 12.0 - 15.0 g/dL   HCT 29.5 (*) 62.1 - 30.8 %   MCV 82.1  78.0 - 100.0 fL   MCH 26.2  26.0 - 34.0 pg   MCHC 31.9  30.0 - 36.0 g/dL   RDW 65.7  84.6 - 96.2 %   Platelets 281  150 - 400 K/uL  COMPREHENSIVE METABOLIC PANEL     Status: Abnormal   Collection Time    01/12/14  5:04 AM      Result Value Ref Range   Sodium 137  137 - 147 mEq/L   Potassium 4.6  3.7 - 5.3 mEq/L   Chloride 103  96 - 112 mEq/L   CO2 24  19 - 32 mEq/L   Glucose, Bld 85  70 - 99 mg/dL   BUN 10  6 - 23 mg/dL   Creatinine, Ser 9.52  0.50 - 1.10 mg/dL   Calcium 8.7  8.4 - 84.1 mg/dL   Total Protein 6.3  6.0 - 8.3 g/dL   Albumin 2.4 (*) 3.5 - 5.2 g/dL   AST 13  0 - 37 U/L   ALT 8  0 - 35 U/L   Alkaline Phosphatase 155 (*) 39 - 117 U/L   Total Bilirubin <0.2 (*) 0.3 - 1.2 mg/dL   GFR calc non Af Amer >90  >90 mL/min   GFR calc Af Amer >90  >90 mL/min   Anion gap 10  5 - 15  URIC ACID     Status: None   Collection Time    01/12/14  5:04 AM      Result Value Ref Range   Uric Acid, Serum 5.4  2.4 - 7.0 mg/dL  LACTATE DEHYDROGENASE     Status: None   Collection Time    01/12/14  5:04 AM      Result Value Ref Range   LDH 188  94 - 250 U/L  POCT FERN TEST     Status: None   Collection Time    01/12/14  5:07 AM      Result Value Ref Range   POCT Fern Test Negative = intact amniotic membranes       Assessment: 1. Hypertension in pregnancy, antepartum, third trimester   2. Vaginal candidiasis     Plan: Diflucan 150 mg  x 1 dose in MAU Discharge home with preeclampsia precautions.  Pt normotensive at time of discharge.  Labor precautions and fetal kick counts Pt to drop off 24 hour urine with Dr Gaynell FaceMarshall on Monday Return to MAU as needed      Follow-up Information   Follow up with Kathreen CosierMARSHALL,BERNARD A, MD.   Specialty:  Obstetrics and Gynecology    Contact information:   [address] [place] [address] [phone]       Follow up In 2 days. (Call on Monday for appointment.  )        Medication List         nitrofurantoin (macrocrystal-monohydrate) 100 MG capsule  Commonly known as:  MACROBID  Take 100 mg by mouth 2 (two) times daily.     prenatal multivitamin Tabs tablet  Take 1 tablet by mouth daily at 12 noon.        Sharen CounterLisa Leftwich-Kirby Certified Nurse-Midwife 01/12/2014 5:55 AM

## 2014-01-12 NOTE — Progress Notes (Signed)
L Leftwich-Kirby CNM in to discuss test results and d/c plan. Written and verbal d/c instructions given and understanding voiced. Will take 24hr urine to office in am. Understands how to collect 24hr urine and has written directions as well. PIH symptoms reviewed

## 2014-01-12 NOTE — MAU Note (Signed)
Leaking fld since 0130. Gush initially and then continued to leak. Cl fld. Some cramping

## 2014-01-12 NOTE — MAU Provider Note (Signed)
History     CSN: [id number]  Arrival date and time: 01/12/14 1406   First Provider Initiated Contact with Patient 01/12/14 1426      Chief Complaint  Patient presents with  . Hypertension   Hypertension Associated symptoms include malaise/fatigue. Pertinent negatives include no blurred vision, chest pain, headaches or shortness of breath.   This is a 23 y.o. female at [redacted]w[redacted]d who presents with c/o "feeling weak" and hands swelling. Was seen here this morning and had a full PIH workup.  She was sent home with a 24 hr urine.  States ate this morning but has not eaten since then (7 hours). Reports drinking "alot of water".  Denies headache or abdominal pain.   OB History   Grav Para Term Preterm Abortions TAB SAB Ect Mult Living   2 1 1  0 0 0 0 0 0 1      Past Medical History  Diagnosis Date  . Medical history non-contributory   . UTI (lower urinary tract infection)     Past Surgical History  Procedure Laterality Date  . No past surgeries      Family History  Problem Relation Age of Onset  . Alcohol abuse Neg Hx     History  Substance Use Topics  . Smoking status: Never Smoker   . Smokeless tobacco: Not on file  . Alcohol Use: No    Allergies: No Known Allergies  Prescriptions prior to admission  Medication Sig Dispense Refill  . nitrofurantoin, macrocrystal-monohydrate, (MACROBID) 100 MG capsule Take 100 mg by mouth 2 (two) times daily.      . Prenatal Vit-Fe Fumarate-FA (PRENATAL MULTIVITAMIN) TABS Take 1 tablet by mouth daily at 12 noon.        Review of Systems  Constitutional: Positive for malaise/fatigue. Negative for fever and chills.  Eyes: Negative for blurred vision and double vision.  Respiratory: Negative for shortness of breath.   Cardiovascular: Negative for chest pain.  Gastrointestinal: Negative for nausea, vomiting and abdominal pain.  Neurological: Positive for dizziness and weakness. Negative for focal weakness, seizures, loss of  consciousness and headaches.   Physical Exam   Blood pressure 146/83, pulse 88, temperature 98.1 F (36.7 C), resp. rate 18, unknown if currently breastfeeding.  Physical Exam  Constitutional: She is oriented to person, place, and time. She appears well-developed and well-nourished.  HENT:  Head: Normocephalic.  Neck: Normal range of motion. Neck supple.  Cardiovascular: Regular rhythm.  Exam reveals no gallop and no friction rub.   Murmur (possible soft systolic) heard. Slight tachycardia low 100s to 90s   Respiratory: Effort normal and breath sounds normal. No respiratory distress (O2 sat 98%). She has no wheezes. She has no rales. She exhibits no tenderness.  GI: Soft. She exhibits no distension. There is no tenderness. There is no rebound and no guarding.  Genitourinary:  deferred  Musculoskeletal: Normal range of motion. She exhibits edema (1+).  Neurological: She is alert and oriented to person, place, and time.  Skin: Skin is warm and dry.  Psychiatric: She has a normal mood and affect.   FHR reactive Rare contractions  MAU Course  Procedures  MDM Results for orders placed during the hospital encounter of 01/12/14 (from the past 24 hour(s))  URINALYSIS, ROUTINE W REFLEX MICROSCOPIC     Status: Abnormal   Collection Time    01/12/14  2:15 PM      Result Value Ref Range   Color, Urine YELLOW  YELLOW   APPearance CLEAR  CLEAR   Specific Gravity, Urine <1.005 (*) 1.005 - 1.030   pH 7.0  5.0 - 8.0   Glucose, UA NEGATIVE  NEGATIVE mg/dL   Hgb urine dipstick NEGATIVE  NEGATIVE   Bilirubin Urine NEGATIVE  NEGATIVE   Ketones, ur NEGATIVE  NEGATIVE mg/dL   Protein, ur NEGATIVE  NEGATIVE mg/dL   Urobilinogen, UA 0.2  0.0 - 1.0 mg/dL   Nitrite NEGATIVE  NEGATIVE   Leukocytes, UA SMALL (*) NEGATIVE  URINE MICROSCOPIC-ADD ON     Status: Abnormal   Collection Time    01/12/14  2:15 PM      Result Value Ref Range   Squamous Epithelial / LPF FEW (*) RARE   WBC, UA 0-2  <3  WBC/hpf   Bacteria, UA RARE  RARE   Given crackers >> states feels better after eating  Assessment and Plan  A:  SIUP at 4639w4d        Gestational Hypertension        Malaise, probably secondary to not eating.   P:  Discharge home       Continue 24 hr urine       F/U with Dr Gaynell Facemarshall tomorrow.  [facility] HospitalWILLIAMS,Sady [place] 01/12/2014, 2:33 PM

## 2014-01-12 NOTE — MAU Note (Signed)
Pt presents to MAU with complaints of not feeling well. She reports she was evaluated this morning here in MAU for hypertension.

## 2014-01-12 NOTE — Discharge Instructions (Signed)
Hypertension During Pregnancy °Hypertension, or high blood pressure, is when there is extra pressure inside your blood vessels that carry blood from the heart to the rest of your body (arteries). It can happen at any time in life, including pregnancy. Hypertension during pregnancy can cause problems for you and your baby. Your baby might not weigh as much as he or she should at birth or might be born early (premature). Very bad cases of hypertension during pregnancy can be life-threatening.  °Different types of hypertension can occur during pregnancy. These include: °· Chronic hypertension. This happens when a woman has hypertension before pregnancy and it continues during pregnancy. °· Gestational hypertension. This is when hypertension develops during pregnancy. °· Preeclampsia or toxemia of pregnancy. This is a very serious type of hypertension that develops only during pregnancy. It affects the whole body and can be very dangerous for both mother and baby.   °Gestational hypertension and preeclampsia usually go away after your baby is born. Your blood pressure will likely stabilize within 6 weeks. Women who have hypertension during pregnancy have a greater chance of developing hypertension later in life or with future pregnancies. °RISK FACTORS °There are certain factors that make it more likely for you to develop hypertension during pregnancy. These include: °· Having hypertension before pregnancy. °· Having hypertension during a previous pregnancy. °· Being overweight. °· Being older than 40 years. °· Being pregnant with more than one baby. °· Having diabetes or kidney problems. °SIGNS AND SYMPTOMS °Chronic and gestational hypertension rarely cause symptoms. Preeclampsia has symptoms, which may include: °· Increased protein in your urine. Your health care provider will check for this at every prenatal visit. °· Swelling of your hands and face. °· Rapid weight gain. °· Headaches. °· Visual changes. °· Being  bothered by light. °· Abdominal pain, especially in the upper right area. °· Chest pain. °· Shortness of breath. °· Increased reflexes. °· Seizures. These occur with a more severe form of preeclampsia, called eclampsia. °DIAGNOSIS  °You may be diagnosed with hypertension during a regular prenatal exam. At each prenatal visit, you may have: °· Your blood pressure checked. °· A urine test to check for protein in your urine. °The type of hypertension you are diagnosed with depends on when you developed it. It also depends on your specific blood pressure reading. °· Developing hypertension before 20 weeks of pregnancy is consistent with chronic hypertension. °· Developing hypertension after 20 weeks of pregnancy is consistent with gestational hypertension. °· Hypertension with increased urinary protein is diagnosed as preeclampsia. °· Blood pressure measurements that stay above 160 systolic or 110 diastolic are a sign of severe preeclampsia. °TREATMENT °Treatment for hypertension during pregnancy varies. Treatment depends on the type of hypertension and how serious it is. °· If you take medicine for chronic hypertension, you may need to switch medicines. °¨ Medicines called ACE inhibitors should not be taken during pregnancy. °¨ Low-dose aspirin may be suggested for women who have risk factors for preeclampsia. °· If you have gestational hypertension, you may need to take a blood pressure medicine that is safe during pregnancy. Your health care provider will recommend the correct medicine. °· If you have severe preeclampsia, you may need to be in the hospital. Health care providers will watch you and your baby very closely. You also may need to take medicine called magnesium sulfate to prevent seizures and lower blood pressure. °· Sometimes, an early delivery is needed. This may be the case if the condition worsens. It would be   done to protect you and your baby. The only cure for preeclampsia is delivery.  Your health  care provider may recommend that you take one low-dose aspirin (81 mg) each day to help prevent high blood pressure during your pregnancy if you are at risk for preeclampsia. You may be at risk for preeclampsia if:  You had preeclampsia or eclampsia during a previous pregnancy.  Your baby did not grow as expected during a previous pregnancy.  You experienced preterm birth with a previous pregnancy.  You experienced a separation of the placenta from the uterus (placental abruption) during a previous pregnancy.  You experienced the loss of your baby during a previous pregnancy.  You are pregnant with more than one baby.  You have other medical conditions, such as diabetes or an autoimmune disease. HOME CARE INSTRUCTIONS  Schedule and keep all of your regular prenatal care appointments. This is important.  Take medicines only as directed by your health care provider. Tell your health care provider about all medicines you take.  Eat as little salt as possible.  Get regular exercise.  Do not drink alcohol.  Do not use tobacco products.  Do not drink products with caffeine.  Lie on your left side when resting. SEEK IMMEDIATE MEDICAL CARE IF:  You have severe abdominal pain.  You have sudden swelling in your hands, ankles, or face.  You gain 4 pounds (1.8 kg) or more in 1 week.  You vomit repeatedly.  You have vaginal bleeding.  You do not feel your baby moving as much.  You have a headache.  You have blurred or double vision.  You have muscle twitching or spasms.  You have shortness of breath.  You have blue fingernails or lips.  You have blood in your urine. MAKE SURE YOU:  Understand these instructions.  Will watch your condition.  Will get help right away if you are not doing well or get worse. Document Released: 02/01/2011 Document Revised: 09/30/2013 Document Reviewed: 12/13/2012 [facility] Patient Information 2015 [place], [place]. This information is not  intended to replace advice given to you by your health care provider. Make sure you discuss any questions you have with your health care provider. Yeast Vaginitis Vaginitis in a soreness, swelling and redness (inflammation) of the vagina and vulva. Monilial vaginitis is not a sexually transmitted infection. CAUSES  Yeast vaginitis is caused by yeast (candida) that is normally found in your vagina. With a yeast infection, the candida has overgrown in number to a point that upsets the chemical balance. SYMPTOMS   White, thick vaginal discharge.  Swelling, itching, redness and irritation of the vagina and possibly the lips of the vagina (vulva).  Burning or painful urination.  Painful intercourse. DIAGNOSIS  Things that may contribute to monilial vaginitis are:  Postmenopausal and virginal states.  Pregnancy.  Infections.  Being tired, sick or stressed, especially if you had monilial vaginitis in the past.  Diabetes. Good control will help lower the chance.  Birth control pills.  Tight fitting garments.  Using bubble bath, feminine sprays, douches or deodorant tampons.  Taking certain medications that kill germs (antibiotics).  Sporadic recurrence can occur if you become ill. TREATMENT  Your caregiver will give you medication.  There are several kinds of anti monilial vaginal creams and suppositories specific for monilial vaginitis. For recurrent yeast infections, use a suppository or cream in the vagina 2 times a week, or as directed.  Anti-monilial or steroid cream for the itching or irritation of the vulva  may also be used. Get your caregiver's permission.  Painting the vagina with methylene blue solution may help if the monilial cream does not work.  Eating yogurt may help prevent monilial vaginitis. HOME CARE INSTRUCTIONS   Finish all medication as prescribed.  Do not have sex until treatment is completed or after your caregiver tells you it is okay.  Take warm  sitz baths.  Do not douche.  Do not use tampons, especially scented ones.  Wear cotton underwear.  Avoid tight pants and panty hose.  Tell your sexual partner that you have a yeast infection. They should go to their caregiver if they have symptoms such as mild rash or itching.  Your sexual partner should be treated as well if your infection is difficult to eliminate.  Practice safer sex. Use condoms.  Some vaginal medications cause latex condoms to fail. Vaginal medications that harm condoms are:  Cleocin cream.  Butoconazole (Femstat).  Terconazole (Terazol) vaginal suppository.  Miconazole (Monistat) (may be purchased over the counter). SEEK MEDICAL CARE IF:   You have a temperature by mouth above 102 F (38.9 C).  The infection is getting worse after 2 days of treatment.  The infection is not getting better after 3 days of treatment.  You develop blisters in or around your vagina.  You develop vaginal bleeding, and it is not your menstrual period.  You have pain when you urinate.  You develop intestinal problems.  You have pain with sexual intercourse. Document Released: 02/23/2005 Document Revised: 08/08/2011 Document Reviewed: 11/07/2008 [facility] Patient Information 2015 [place], [place]. This information is not intended to replace advice given to you by your health care provider. Make sure you discuss any questions you have with your health care provider. 24-Hour Urine Collection HOME CARE  When you get up in the morning on the day you do this test, pee (urinate) in the toilet and flush. Make a note of the time. This will be your start time on the day of collection and the end time on the next morning.  From then on, save all your pee (urine) in the plastic jug that was given to you.  You should stop collecting your pee 24 hours after you started.  If the plastic jug that is given to you already has liquid in it, that is okay. Do not throw out the liquid  or rinse out the jug. Some tests need the liquid to be added to your pee.  Keep your plastic jug cool (in an ice chest or the refrigerator) during the test.  When the 24 hours is over, bring your plastic jug to the clinic lab. Keep the jug cool (in an ice chest) while you are bringing it to the lab. Document Released: 08/12/2008 Document Revised: 08/08/2011 Document Reviewed: 08/12/2008 [facility] Patient Information 2015 [place], [place]. This information is not intended to replace advice given to you by your health care provider. Make sure you discuss any questions you have with your health care provider.

## 2014-01-13 LAB — CREATININE CLEARANCE, URINE, 24 HOUR
CREAT CLEAR: 234 mL/min — AB (ref 75–115)
CREATININE 24H UR: 1582 mg/d (ref 700–1800)
CREATININE: 0.47 mg/dL — AB (ref 0.50–1.10)
Collection Interval-CRCL: 24 hours
Creatinine, Urine: 58.07 mg/dL
Urine Total Volume-CRCL: 2725 mL

## 2014-01-14 LAB — PROTEIN, URINE, 24 HOUR
COLLECTION INTERVAL-UPROT: 24 h
Protein, 24H Urine: 82 mg/d (ref 50–100)
Protein, Urine: 3 mg/dL
Urine Total Volume-UPROT: 2725 mL

## 2014-01-22 ENCOUNTER — Inpatient Hospital Stay (HOSPITAL_COMMUNITY): Payer: Medicaid Other | Admitting: Anesthesiology

## 2014-01-22 ENCOUNTER — Encounter (HOSPITAL_COMMUNITY): Payer: Self-pay | Admitting: *Deleted

## 2014-01-22 ENCOUNTER — Encounter (HOSPITAL_COMMUNITY): Payer: Medicaid Other | Admitting: Anesthesiology

## 2014-01-22 ENCOUNTER — Inpatient Hospital Stay (HOSPITAL_COMMUNITY)
Admission: AD | Admit: 2014-01-22 | Discharge: 2014-01-24 | DRG: 775 | Disposition: A | Discharge status: Home / Self Care | Payer: Medicaid Other | Source: Ambulatory Visit | Attending: Obstetrics | Admitting: Obstetrics

## 2014-01-22 DIAGNOSIS — Z3490 Encounter for supervision of normal pregnancy, unspecified, unspecified trimester: Secondary | ICD-10-CM

## 2014-01-22 DIAGNOSIS — O99892 Other specified diseases and conditions complicating childbirth: Secondary | ICD-10-CM | POA: Diagnosis present

## 2014-01-22 DIAGNOSIS — R030 Elevated blood-pressure reading, without diagnosis of hypertension: Secondary | ICD-10-CM | POA: Diagnosis present

## 2014-01-22 DIAGNOSIS — O9989 Other specified diseases and conditions complicating pregnancy, childbirth and the puerperium: Secondary | ICD-10-CM

## 2014-01-22 LAB — URIC ACID: URIC ACID, SERUM: 5.3 mg/dL (ref 2.4–7.0)

## 2014-01-22 LAB — COMPREHENSIVE METABOLIC PANEL
ALT: 9 U/L (ref 0–35)
AST: 14 U/L (ref 0–37)
Albumin: 2.5 g/dL — ABNORMAL LOW (ref 3.5–5.2)
Alkaline Phosphatase: 147 U/L — ABNORMAL HIGH (ref 39–117)
Anion gap: 11 (ref 5–15)
BILIRUBIN TOTAL: 0.2 mg/dL — AB (ref 0.3–1.2)
BUN: 11 mg/dL (ref 6–23)
CO2: 21 meq/L (ref 19–32)
CREATININE: 0.52 mg/dL (ref 0.50–1.10)
Calcium: 8.4 mg/dL (ref 8.4–10.5)
Chloride: 104 mEq/L (ref 96–112)
GFR calc Af Amer: >90 mL/min (ref >90)
GFR calc non Af Amer: >90 mL/min (ref >90)
GLUCOSE: 72 mg/dL (ref 70–99)
Potassium: 4.6 mEq/L (ref 3.7–5.3)
Sodium: 136 mEq/L — ABNORMAL LOW (ref 137–147)
Total Protein: 6 g/dL (ref 6.0–8.3)

## 2014-01-22 LAB — TYPE AND SCREEN
ABO/RH(D): O POS
Antibody Screen: NEGATIVE

## 2014-01-22 LAB — LACTATE DEHYDROGENASE: LDH: 190 U/L (ref 94–250)

## 2014-01-22 LAB — CBC
HCT: 32.7 % — ABNORMAL LOW (ref 36.0–46.0)
HEMOGLOBIN: 10.3 g/dL — AB (ref 12.0–15.0)
MCH: 26 pg (ref 26.0–34.0)
MCHC: 31.5 g/dL (ref 30.0–36.0)
MCV: 82.6 fL (ref 78.0–100.0)
Platelets: 262 10*3/uL (ref 150–400)
RBC: 3.96 MIL/uL (ref 3.87–5.11)
RDW: 16 % — ABNORMAL HIGH (ref 11.5–15.5)
WBC: 9.6 10*3/uL (ref 4.0–10.5)

## 2014-01-22 MED ORDER — FERROUS SULFATE 325 (65 FE) MG PO TABS
325.0000 mg | ORAL_TABLET | Freq: Two times a day (BID) | ORAL | Status: DC
  Administered 2014-01-23 – 2014-01-24 (×3): 325 mg via ORAL
  Filled 2014-01-22 (×3): qty 1

## 2014-01-22 MED ORDER — SIMETHICONE 80 MG PO CHEW: 80.0000 mg | CHEWABLE_TABLET | ORAL | Status: DC | PRN

## 2014-01-22 MED ORDER — OXYCODONE-ACETAMINOPHEN 5-325 MG PO TABS: 1.0000 | ORAL_TABLET | ORAL | Status: DC | PRN

## 2014-01-22 MED ORDER — TETANUS-DIPHTH-ACELL PERTUSSIS 5-2.5-18.5 LF-MCG/0.5 IM SUSP: 0.5000 mL | Freq: Once | INTRAMUSCULAR | Status: DC

## 2014-01-22 MED ORDER — DIPHENHYDRAMINE HCL 25 MG PO CAPS: 25.0000 mg | ORAL_CAPSULE | Freq: Four times a day (QID) | ORAL | Status: DC | PRN

## 2014-01-22 MED ORDER — DIBUCAINE 1 % RE OINT: 1.0000 "application " | TOPICAL_OINTMENT | RECTAL | Status: DC | PRN

## 2014-01-22 MED ORDER — FENTANYL 2.5 MCG/ML BUPIVACAINE 1/10 % EPIDURAL INFUSION (WH - ANES)
14.0000 mL/h | INTRAMUSCULAR | Status: DC | PRN
  Administered 2014-01-22: 14 mL/h via EPIDURAL

## 2014-01-22 MED ORDER — OXYTOCIN 40 UNITS IN LACTATED RINGERS INFUSION - SIMPLE MED
62.5000 mL/h | INTRAVENOUS | Status: DC
  Filled 2014-01-22: qty 1000

## 2014-01-22 MED ORDER — ACETAMINOPHEN 325 MG PO TABS: 650.0000 mg | ORAL_TABLET | ORAL | Status: DC | PRN

## 2014-01-22 MED ORDER — FENTANYL 2.5 MCG/ML BUPIVACAINE 1/10 % EPIDURAL INFUSION (WH - ANES)
14.0000 mL/h | INTRAMUSCULAR | Status: DC | PRN
  Filled 2014-01-22: qty 125

## 2014-01-22 MED ORDER — EPHEDRINE 5 MG/ML INJ
10.0000 mg | INTRAVENOUS | Status: DC | PRN
  Filled 2014-01-22: qty 2

## 2014-01-22 MED ORDER — ZOLPIDEM TARTRATE 5 MG PO TABS: 5.0000 mg | ORAL_TABLET | Freq: Every evening | ORAL | Status: DC | PRN

## 2014-01-22 MED ORDER — FLEET ENEMA 7-19 GM/118ML RE ENEM: 1.0000 | ENEMA | RECTAL | Status: DC | PRN

## 2014-01-22 MED ORDER — PRENATAL MULTIVITAMIN CH
1.0000 | ORAL_TABLET | Freq: Every day | ORAL | Status: DC
  Administered 2014-01-23: 1 via ORAL
  Filled 2014-01-22: qty 1

## 2014-01-22 MED ORDER — IBUPROFEN 600 MG PO TABS: 600.0000 mg | ORAL_TABLET | Freq: Four times a day (QID) | ORAL | Status: DC | PRN

## 2014-01-22 MED ORDER — LIDOCAINE HCL (PF) 1 % IJ SOLN
30.0000 mL | INTRAMUSCULAR | Status: AC | PRN
  Administered 2014-01-22: 10 mL via SUBCUTANEOUS

## 2014-01-22 MED ORDER — PHENYLEPHRINE 40 MCG/ML (10ML) SYRINGE FOR IV PUSH (FOR BLOOD PRESSURE SUPPORT)
80.0000 ug | PREFILLED_SYRINGE | INTRAVENOUS | Status: DC | PRN
  Filled 2014-01-22: qty 2

## 2014-01-22 MED ORDER — ONDANSETRON HCL 4 MG/2ML IJ SOLN: 4.0000 mg | Freq: Four times a day (QID) | INTRAMUSCULAR | Status: DC | PRN

## 2014-01-22 MED ORDER — LANOLIN HYDROUS EX OINT: TOPICAL_OINTMENT | CUTANEOUS | Status: DC | PRN

## 2014-01-22 MED ORDER — SENNOSIDES-DOCUSATE SODIUM 8.6-50 MG PO TABS
2.0000 | ORAL_TABLET | ORAL | Status: DC
  Administered 2014-01-22 – 2014-01-24 (×2): 2 via ORAL
  Filled 2014-01-22 (×2): qty 2

## 2014-01-22 MED ORDER — LIDOCAINE HCL (PF) 1 % IJ SOLN
INTRAMUSCULAR | Status: AC
  Administered 2014-01-22: 30 mL
  Filled 2014-01-22: qty 30

## 2014-01-22 MED ORDER — BUTORPHANOL TARTRATE 1 MG/ML IJ SOLN
1.0000 mg | INTRAMUSCULAR | Status: DC | PRN
  Administered 2014-01-22: 1 mg via INTRAVENOUS
  Filled 2014-01-22: qty 1

## 2014-01-22 MED ORDER — WITCH HAZEL-GLYCERIN EX PADS: 1.0000 "application " | MEDICATED_PAD | CUTANEOUS | Status: DC | PRN

## 2014-01-22 MED ORDER — OXYTOCIN BOLUS FROM INFUSION
500.0000 mL | INTRAVENOUS | Status: DC
  Administered 2014-01-22: 500 mL via INTRAVENOUS

## 2014-01-22 MED ORDER — BENZOCAINE-MENTHOL 20-0.5 % EX AERO: 1.0000 "application " | INHALATION_SPRAY | CUTANEOUS | Status: DC | PRN

## 2014-01-22 MED ORDER — DIPHENHYDRAMINE HCL 50 MG/ML IJ SOLN: 12.5000 mg | INTRAMUSCULAR | Status: DC | PRN

## 2014-01-22 MED ORDER — TERBUTALINE SULFATE 1 MG/ML IJ SOLN: 0.2500 mg | Freq: Once | INTRAMUSCULAR | Status: DC | PRN

## 2014-01-22 MED ORDER — OXYTOCIN 40 UNITS IN LACTATED RINGERS INFUSION - SIMPLE MED
1.0000 m[IU]/min | INTRAVENOUS | Status: DC
Start: 2014-01-22 — End: 2014-01-22
  Administered 2014-01-22: 2 m[IU]/min via INTRAVENOUS

## 2014-01-22 MED ORDER — CITRIC ACID-SODIUM CITRATE 334-500 MG/5ML PO SOLN
30.0000 mL | ORAL | Status: DC | PRN
Start: 2014-01-22 — End: 2014-01-22

## 2014-01-22 MED ORDER — ONDANSETRON HCL 4 MG PO TABS: 4.0000 mg | ORAL_TABLET | ORAL | Status: DC | PRN

## 2014-01-22 MED ORDER — PHENYLEPHRINE 40 MCG/ML (10ML) SYRINGE FOR IV PUSH (FOR BLOOD PRESSURE SUPPORT)
80.0000 ug | PREFILLED_SYRINGE | INTRAVENOUS | Status: DC | PRN
  Filled 2014-01-22: qty 2
  Filled 2014-01-22: qty 10

## 2014-01-22 MED ORDER — ONDANSETRON HCL 4 MG/2ML IJ SOLN: 4.0000 mg | INTRAMUSCULAR | Status: DC | PRN

## 2014-01-22 MED ORDER — LACTATED RINGERS IV SOLN: INTRAVENOUS | Status: DC

## 2014-01-22 MED ORDER — LACTATED RINGERS IV SOLN
500.0000 mL | Freq: Once | INTRAVENOUS | Status: AC
  Administered 2014-01-22: 500 mL via INTRAVENOUS

## 2014-01-22 MED ORDER — IBUPROFEN 600 MG PO TABS
600.0000 mg | ORAL_TABLET | Freq: Four times a day (QID) | ORAL | Status: DC
  Administered 2014-01-22 – 2014-01-24 (×6): 600 mg via ORAL
  Filled 2014-01-22 (×6): qty 1

## 2014-01-22 MED ORDER — LACTATED RINGERS IV SOLN: 500.0000 mL | INTRAVENOUS | Status: DC | PRN

## 2014-01-22 NOTE — Anesthesia Preprocedure Evaluation (Addendum)

## 2014-01-22 NOTE — H&P (Signed)
This dictating the history and physical on  Daisy Marquez she is a 23 year old gravida 2 para 1001 at 40 weeks EDC 01/22/1949 negative GBS she was seen in the office today with a blood pressure of 1 6001 102 1+ protein is normal PIH labs cervix 2 cm 80% vertex -1 decided should've induced at term because of elevated blood pressure since admission her blood pressures are normal she is contracting irregularly on Pitocin cervix is 2 cm 80% vertex -1 amniotomy performed fluid was clear IUPC inserted  Past medical history negative Past surgica review negative Physical exam well-developed female in no distress HEENT negative Lungs clear to P&A Heart regular rhythm no murmurs no gallops Breasts negative Heart regular rhythm no murmurs no gallops Pelvic as described above Extremities negative l history negative Social history negative Systemis Dr. Francoise Ceo

## 2014-01-22 NOTE — Anesthesia Procedure Notes (Signed)

## 2014-01-23 LAB — CBC
HEMATOCRIT: 27.5 % — AB (ref 36.0–46.0)
HEMOGLOBIN: 8.7 g/dL — AB (ref 12.0–15.0)
MCH: 26 pg (ref 26.0–34.0)
MCHC: 31.6 g/dL (ref 30.0–36.0)
MCV: 82.1 fL (ref 78.0–100.0)
Platelets: 203 10*3/uL (ref 150–400)
RBC: 3.35 MIL/uL — ABNORMAL LOW (ref 3.87–5.11)
RDW: 16.2 % — AB (ref 11.5–15.5)
WBC: 14.8 10*3/uL — ABNORMAL HIGH (ref 4.0–10.5)

## 2014-01-23 LAB — RPR

## 2014-01-23 NOTE — Anesthesia Postprocedure Evaluation (Signed)
  Anesthesia Post-op Note  Patient: Daisy Marquez  Procedure(s) Performed: * No procedures listed *  Patient Location: Mother/Baby  Anesthesia Type:Epidural  Level of Consciousness: awake, alert , oriented and patient cooperative  Airway and Oxygen Therapy: Patient Spontanous Breathing  Post-op Pain: mild  Post-op Assessment: Patient's Cardiovascular Status Stable, Respiratory Function Stable, No headache, No backache, No residual numbness and No residual motor weakness  Post-op Vital Signs: stable  Last Vitals:  Filed Vitals:   01/23/14 0558  BP: 140/79  Pulse: 67  Temp: 36.6 C  Resp: 16    Complications: No apparent anesthesia complications

## 2014-01-23 NOTE — Progress Notes (Signed)
CM / UR chart review completed.  

## 2014-01-23 NOTE — Lactation Note (Signed)
This note was copied from the chart of Daisy Marquez. Lactation Consultation Note  Patient Name: Daisy Marquez [id number] Date: 01/23/2014 Reason for consult: Other (Comment) (charting for exclusion)   Maternal Data Formula Feeding for Exclusion: Yes Reason for exclusion: Mother's choice to formula feed on admision  Feeding Nipple Type: Slow - flow  LATCH Score/Interventions                      Lactation Tools Discussed/Used     Consult Status Consult Status: Complete    Lynda Rainwater 01/23/2014, 4:19 PM

## 2014-01-23 NOTE — Progress Notes (Signed)
Patient ID: Daisy Marquez, female   DOB: 09/27/1990, 23 y.o.   MRN: [id number] Postpartum day one Vital signs normal Blood pressure is normal Fundus firm Lochia moderate Doing well

## 2014-01-24 NOTE — Discharge Summary (Signed)
Obstetric Discharge Summary Reason for Admission: induction of labor Prenatal Procedures: none Intrapartum Procedures: spontaneous vaginal delivery Postpartum Procedures: none Complications-Operative and Postpartum: none Hemoglobin  Date Value Ref Range Status  01/23/2014 8.7* 12.0 - 15.0 g/dL Final     HCT  Date Value Ref Range Status  01/23/2014 27.5* 36.0 - 46.0 % Final    Physical Exam:  General: alert Lochia: appropriate Uterine Fundus: firm Incision: healing well DVT Evaluation: No evidence of DVT seen on physical exam.  Discharge Diagnoses: Term Pregnancy-delivered  Discharge Information: Date: 01/24/2014 Activity: pelvic rest Diet: routine Medications: Percocet Condition: stable Instructions: refer to practice specific booklet Discharge to: home Follow-up Information   Follow up with Kathreen Cosier, MD On 03/06/2014.   Specialty:  Obstetrics and Gynecology   Contact information:   [address] [place] [address] [phone]       Follow up with Kathreen Cosier, MD.   Specialty:  Obstetrics and Gynecology   Contact information:   [address] [place] [address] [phone]       Newborn Data: Live born female  Birth Weight: 6 lb 15 oz (3147 g) APGAR: 9, 9  Home with mother.  Tymara Saur A 01/24/2014, 6:47 AM

## 2014-03-31 ENCOUNTER — Encounter (HOSPITAL_COMMUNITY): Payer: Self-pay | Admitting: *Deleted

## 2016-05-30 NOTE — L&D Delivery Note (Signed)
26 y.o. G3P3003 at 844w4d delivered a viable female infant in cephalic, LOA position, with moderate to thick meconium fluid. No nuchal cord. Right anterior shoulder delivered with ease. Baby was vigorously crying. 60 sec delayed cord clamping. Cord clamped x2 and cut. Placenta delivered spontaneously intact, with 3VC. Fundus firm on exam with massage and pitocin. Good hemostasis noted.  Anesthesia: Epidural Laceration: 1st degree perineal lac Suture: 3-0 Monocryl Good hemostasis noted. EBL: 100cc  Mom and baby recovering in LDR.    Apgars: APGAR (1 MIN): 8   APGAR (5 MINS): 9     Weight: 3880g; 8lb 8.9oz    Jen MowElizabeth Mumaw, DO OB Fellow [facility] 10/09/2016, 10:55 PM

## 2016-06-14 ENCOUNTER — Ambulatory Visit (INDEPENDENT_AMBULATORY_CARE_PROVIDER_SITE_OTHER): Payer: Self-pay

## 2016-06-14 DIAGNOSIS — Z3201 Encounter for pregnancy test, result positive: Secondary | ICD-10-CM

## 2016-06-14 DIAGNOSIS — N926 Irregular menstruation, unspecified: Secondary | ICD-10-CM

## 2016-06-14 LAB — POCT URINE PREGNANCY: Preg Test, Ur: POSITIVE — AB

## 2016-06-14 NOTE — Progress Notes (Signed)
Pt presents for UPT. UPT positive. LMP 12/30/15. PNV samples given.

## 2016-07-07 ENCOUNTER — Other Ambulatory Visit (HOSPITAL_COMMUNITY)
Admission: RE | Admit: 2016-07-07 | Discharge: 2016-07-07 | Disposition: A | Discharge status: Home / Self Care | Payer: Medicaid Other | Source: Ambulatory Visit | Attending: Obstetrics & Gynecology | Admitting: Obstetrics & Gynecology

## 2016-07-07 ENCOUNTER — Ambulatory Visit (INDEPENDENT_AMBULATORY_CARE_PROVIDER_SITE_OTHER): Payer: Medicaid Other | Admitting: Obstetrics & Gynecology

## 2016-07-07 ENCOUNTER — Encounter: Payer: Self-pay | Admitting: Obstetrics & Gynecology

## 2016-07-07 DIAGNOSIS — Z3480 Encounter for supervision of other normal pregnancy, unspecified trimester: Secondary | ICD-10-CM | POA: Insufficient documentation

## 2016-07-07 DIAGNOSIS — Z113 Encounter for screening for infections with a predominantly sexual mode of transmission: Secondary | ICD-10-CM | POA: Insufficient documentation

## 2016-07-07 DIAGNOSIS — O0932 Supervision of pregnancy with insufficient antenatal care, second trimester: Secondary | ICD-10-CM | POA: Diagnosis not present

## 2016-07-07 DIAGNOSIS — Z3490 Encounter for supervision of normal pregnancy, unspecified, unspecified trimester: Secondary | ICD-10-CM | POA: Insufficient documentation

## 2016-07-07 DIAGNOSIS — O0930 Supervision of pregnancy with insufficient antenatal care, unspecified trimester: Secondary | ICD-10-CM | POA: Insufficient documentation

## 2016-07-07 DIAGNOSIS — Z23 Encounter for immunization: Secondary | ICD-10-CM | POA: Diagnosis not present

## 2016-07-07 NOTE — Progress Notes (Signed)
  Subjective:    Daisy Marquez is a G3P2002 7248w1d being seen today for her first obstetrical visit.  Her obstetrical history is significant for late prenatal care, h/o preeclampsia. Patient does not intend to breast feed. Pregnancy history fully reviewed.  Patient reports no complaints.  Vitals:   07/07/16 1433  BP: 121/73  Pulse: 89  Temp: 97.2 F (36.2 C)  Weight: 190 lb 3.2 oz (86.3 kg)    HISTORY: OB History  Gravida Para Term Preterm AB Living  3 2 2  0 0 2  SAB TAB Ectopic Multiple Live Births  0 0 0 0 2    # Outcome Date GA Lbr Len/2nd Weight Sex Delivery Anes PTL Lv  3 Current           2 Term 01/22/14 2268w0d 03:21 / 00:21 6 lb 15 oz (3.147 kg) F Vag-Spont EPI  LIV  1 Term 12/13/12 5145w5d 15:02 / 00:28 7 lb (3.175 kg) M Vag-Spont EPI  LIV     Past Medical History:  Diagnosis Date  . Anemia   . Medical history non-contributory   . UTI (lower urinary tract infection)    Past Surgical History:  Procedure Laterality Date  . NO PAST SURGERIES     Family History  Problem Relation Age of Onset  . Alcohol abuse Neg Hx      Exam    Uterus:     Pelvic Exam:    Perineum: No Hemorrhoids   Vulva: normal   Vagina:  normal mucosa   pH:     Cervix: no lesions   Adnexa: not evaluated   Bony Pelvis: average  System: Breast:  normal appearance, no masses or tenderness   Skin: normal coloration and turgor, no rashes    Neurologic: oriented, normal mood   Extremities: normal strength, tone, and muscle mass   HEENT extra ocular movement intact and sclera clear, anicteric   Mouth/Teeth mucous membranes moist, pharynx normal without lesions and dental hygiene good   Neck supple   Cardiovascular: regular rate and rhythm   Respiratory:  appears well, vitals normal, no respiratory distress, acyanotic, normal RR, neck free of mass or lymphadenopathy   Abdomen: soft, non-tender; bowel sounds normal; no masses,  no organomegaly   Urinary: urethral meatus normal       Assessment:    Pregnancy: [id number] Patient Active Problem List   Diagnosis Date Noted  . Supervision of normal pregnancy, antepartum 07/07/2016  . Late prenatal care affecting pregnancy, antepartum 07/07/2016        Plan:     Initial labs drawn. Prenatal vitamins. Problem list reviewed and updated. Genetic Screening too late to care  Ultrasound discussed; fetal survey: ordered.  Follow up in 2 weeks. 50% of 30 min visit spent on counseling and coordination of care.  2 hr GTT NV   Scheryl DarterJames Jaymeson Mengel 07/07/2016

## 2016-07-07 NOTE — Progress Notes (Signed)
Patient reports good fetal movement, and complains of back pain, denies contractions.

## 2016-07-10 LAB — URINE CULTURE, OB REFLEX

## 2016-07-10 LAB — CULTURE, OB URINE

## 2016-07-11 LAB — GC/CHLAMYDIA PROBE AMP (~~LOC~~) NOT AT ARMC
Chlamydia: NEGATIVE
Neisseria Gonorrhea: NEGATIVE

## 2016-07-12 ENCOUNTER — Ambulatory Visit (HOSPITAL_COMMUNITY)
Admission: RE | Admit: 2016-07-12 | Discharge: 2016-07-12 | Disposition: A | Discharge status: Home / Self Care | Payer: Medicaid Other | Source: Ambulatory Visit | Attending: Obstetrics & Gynecology | Admitting: Obstetrics & Gynecology

## 2016-07-12 ENCOUNTER — Other Ambulatory Visit: Payer: Self-pay | Admitting: Obstetrics & Gynecology

## 2016-07-12 ENCOUNTER — Encounter (HOSPITAL_COMMUNITY): Payer: Self-pay

## 2016-07-12 DIAGNOSIS — O0932 Supervision of pregnancy with insufficient antenatal care, second trimester: Secondary | ICD-10-CM | POA: Diagnosis not present

## 2016-07-12 DIAGNOSIS — Z3689 Encounter for other specified antenatal screening: Secondary | ICD-10-CM

## 2016-07-12 DIAGNOSIS — Z3A27 27 weeks gestation of pregnancy: Secondary | ICD-10-CM

## 2016-07-12 DIAGNOSIS — Z8759 Personal history of other complications of pregnancy, childbirth and the puerperium: Secondary | ICD-10-CM

## 2016-07-12 DIAGNOSIS — Z3480 Encounter for supervision of other normal pregnancy, unspecified trimester: Secondary | ICD-10-CM

## 2016-07-12 DIAGNOSIS — O09292 Supervision of pregnancy with other poor reproductive or obstetric history, second trimester: Secondary | ICD-10-CM | POA: Diagnosis not present

## 2016-07-12 DIAGNOSIS — O99212 Obesity complicating pregnancy, second trimester: Secondary | ICD-10-CM | POA: Diagnosis not present

## 2016-07-12 LAB — OBSTETRIC PANEL, INCLUDING HIV
ANTIBODY SCREEN: NEGATIVE
BASOS: 0 %
Basophils Absolute: 0 10*3/uL (ref 0.0–0.2)
EOS (ABSOLUTE): 0.1 10*3/uL (ref 0.0–0.4)
EOS: 1 %
HEMATOCRIT: 36.1 % (ref 34.0–46.6)
HIV SCREEN 4TH GENERATION: NONREACTIVE
Hemoglobin: 12.1 g/dL (ref 11.1–15.9)
Hepatitis B Surface Ag: NEGATIVE
Immature Grans (Abs): 0.1 10*3/uL (ref 0.0–0.1)
Immature Granulocytes: 1 %
Lymphocytes Absolute: 1 10*3/uL (ref 0.7–3.1)
Lymphs: 11 %
MCH: 30.3 pg (ref 26.6–33.0)
MCHC: 33.5 g/dL (ref 31.5–35.7)
MCV: 90 fL (ref 79–97)
MONOS ABS: 0.9 10*3/uL (ref 0.1–0.9)
Monocytes: 10 %
NEUTROS ABS: 6.7 10*3/uL (ref 1.4–7.0)
Neutrophils: 77 %
Platelets: 268 10*3/uL (ref 150–379)
RBC: 4 x10E6/uL (ref 3.77–5.28)
RDW: 13.6 % (ref 12.3–15.4)
RH TYPE: POSITIVE
RPR Ser Ql: NONREACTIVE
RUBELLA: 1.03 {index} (ref >0.99)
WBC: 8.8 10*3/uL (ref 3.4–10.8)

## 2016-07-12 LAB — HEMOGLOBINOPATHY EVALUATION
HEMOGLOBIN F QUANTITATION: 0 % (ref 0.0–2.0)
HGB A: 97.3 % (ref 96.4–98.8)
HGB C: 0 %
HGB S: 0 %
HGB VARIANT: 0 %
Hemoglobin A2 Quantitation: 2.7 % (ref 1.8–3.2)

## 2016-07-12 LAB — CYTOLOGY - PAP: Diagnosis: NEGATIVE

## 2016-07-12 LAB — VARICELLA ZOSTER ANTIBODY, IGG: Varicella zoster IgG: 479 index (ref >165)

## 2016-07-13 LAB — TOXASSURE SELECT 13 (MW), URINE

## 2016-07-21 ENCOUNTER — Other Ambulatory Visit: Payer: Medicaid Other

## 2016-07-21 ENCOUNTER — Ambulatory Visit (INDEPENDENT_AMBULATORY_CARE_PROVIDER_SITE_OTHER): Payer: Medicaid Other | Admitting: Certified Nurse Midwife

## 2016-07-21 VITALS — BP 114/74 | HR 84 | Wt 192.0 lb

## 2016-07-21 DIAGNOSIS — Z3480 Encounter for supervision of other normal pregnancy, unspecified trimester: Secondary | ICD-10-CM

## 2016-07-21 DIAGNOSIS — R42 Dizziness and giddiness: Secondary | ICD-10-CM

## 2016-07-21 DIAGNOSIS — Z8759 Personal history of other complications of pregnancy, childbirth and the puerperium: Secondary | ICD-10-CM | POA: Insufficient documentation

## 2016-07-21 DIAGNOSIS — O0933 Supervision of pregnancy with insufficient antenatal care, third trimester: Secondary | ICD-10-CM

## 2016-07-21 DIAGNOSIS — O0930 Supervision of pregnancy with insufficient antenatal care, unspecified trimester: Secondary | ICD-10-CM

## 2016-07-21 DIAGNOSIS — Z23 Encounter for immunization: Secondary | ICD-10-CM

## 2016-07-21 DIAGNOSIS — O09299 Supervision of pregnancy with other poor reproductive or obstetric history, unspecified trimester: Secondary | ICD-10-CM

## 2016-07-21 DIAGNOSIS — Z3483 Encounter for supervision of other normal pregnancy, third trimester: Secondary | ICD-10-CM

## 2016-07-21 DIAGNOSIS — O09293 Supervision of pregnancy with other poor reproductive or obstetric history, third trimester: Secondary | ICD-10-CM

## 2016-07-21 MED ORDER — MECLIZINE HCL 25 MG PO TABS: 25.0000 mg | ORAL_TABLET | Freq: Three times a day (TID) | ORAL | 0 refills | Status: DC | PRN

## 2016-07-21 NOTE — Progress Notes (Signed)
Pt states that she has been having dizzy episodes. Pt states that she is eating / drinking well.

## 2016-07-21 NOTE — Progress Notes (Signed)
   PRENATAL VISIT NOTE  Subjective:  Daisy Marquez is a 26 y.o. G3P2002 at 4233w1d being seen today for ongoing prenatal care.  She is currently monitored for the following issues for this low-risk pregnancy and has Supervision of normal pregnancy, antepartum; Late prenatal care affecting pregnancy, antepartum; and Hx of preeclampsia, prior pregnancy, currently pregnant on her problem list.  Patient reports no bleeding, no contractions, no cramping, no leaking and states that she has dizziness with each pregnancy.  Contractions: Not present. Vag. Bleeding: None.  Movement: Present. Denies leaking of fluid.   The following portions of the patient's history were reviewed and updated as appropriate: allergies, current medications, past family history, past medical history, past social history, past surgical history and problem list. Problem list updated.  Objective:   Vitals:   07/21/16 1007  BP: 114/74  Pulse: 84  Weight: 192 lb (87.1 kg)    Fetal Status: Fetal Heart Rate (bpm): 155 Fundal Height: 29 cm Movement: Present     General:  Alert, oriented and cooperative. Patient is in no acute distress.  Skin: Skin is warm and dry. No rash noted.   Cardiovascular: Normal heart rate noted  Respiratory: Normal respiratory effort, no problems with respiration noted  Abdomen: Soft, gravid, appropriate for gestational age. Pain/Pressure: Absent     Pelvic:  Cervical exam deferred        Extremities: Normal range of motion.     Mental Status: Normal mood and affect. Normal behavior. Normal judgment and thought content.   Assessment and Plan:  Pregnancy: G3P2002 at 4533w1d  1. Supervision of other normal pregnancy, antepartum     Vertigo:Meclizine ordered   - Glucose Tolerance, 2 Hours w/1 Hour - CBC - HIV antibody - RPR - Tdap vaccine greater than or equal to 7yo IM  2. Late prenatal care affecting pregnancy, antepartum  - US MFM OB FOLLOW UP; Future  3. Hx of preeclampsia, prior  pregnancy, currently pregnant     Starting baby ASA  Preterm labor symptoms and general obstetric precautions including but not limited to vaginal bleeding, contractions, leaking of fluid and fetal movement were reviewed in detail with the patient. Please refer to After Visit Summary for other counseling recommendations.  Return in about 2 weeks (around 08/04/2016) for ROB.   Roe Coombsachelle A Natanel Snavely, CNM

## 2016-07-22 ENCOUNTER — Other Ambulatory Visit: Payer: Self-pay | Admitting: Certified Nurse Midwife

## 2016-07-22 DIAGNOSIS — Z3480 Encounter for supervision of other normal pregnancy, unspecified trimester: Secondary | ICD-10-CM

## 2016-07-22 LAB — CBC
HEMATOCRIT: 35.1 % (ref 34.0–46.6)
HEMOGLOBIN: 11.2 g/dL (ref 11.1–15.9)
MCH: 28.9 pg (ref 26.6–33.0)
MCHC: 31.9 g/dL (ref 31.5–35.7)
MCV: 91 fL (ref 79–97)
Platelets: 237 10*3/uL (ref 150–379)
RBC: 3.87 x10E6/uL (ref 3.77–5.28)
RDW: 14.2 % (ref 12.3–15.4)
WBC: 8.3 10*3/uL (ref 3.4–10.8)

## 2016-07-22 LAB — GLUCOSE TOLERANCE, 2 HOURS W/ 1HR
Glucose, 1 hour: 90 mg/dL (ref 65–179)
Glucose, 2 hour: 118 mg/dL (ref 65–152)
Glucose, Fasting: 79 mg/dL (ref 65–91)

## 2016-07-22 LAB — HIV ANTIBODY (ROUTINE TESTING W REFLEX): HIV SCREEN 4TH GENERATION: NONREACTIVE

## 2016-07-22 LAB — RPR: RPR Ser Ql: NONREACTIVE

## 2016-08-04 ENCOUNTER — Encounter: Payer: Self-pay | Admitting: Certified Nurse Midwife

## 2016-08-04 ENCOUNTER — Ambulatory Visit (INDEPENDENT_AMBULATORY_CARE_PROVIDER_SITE_OTHER): Payer: Medicaid Other | Admitting: Certified Nurse Midwife

## 2016-08-04 VITALS — BP 126/74 | HR 91 | Wt 195.6 lb

## 2016-08-04 DIAGNOSIS — O0930 Supervision of pregnancy with insufficient antenatal care, unspecified trimester: Secondary | ICD-10-CM

## 2016-08-04 DIAGNOSIS — Z3480 Encounter for supervision of other normal pregnancy, unspecified trimester: Secondary | ICD-10-CM

## 2016-08-04 DIAGNOSIS — O0933 Supervision of pregnancy with insufficient antenatal care, third trimester: Secondary | ICD-10-CM

## 2016-08-04 DIAGNOSIS — O09299 Supervision of pregnancy with other poor reproductive or obstetric history, unspecified trimester: Secondary | ICD-10-CM

## 2016-08-04 DIAGNOSIS — O09293 Supervision of pregnancy with other poor reproductive or obstetric history, third trimester: Secondary | ICD-10-CM

## 2016-08-04 MED ORDER — ASPIRIN 81 MG PO CHEW: 81.0000 mg | CHEWABLE_TABLET | Freq: Every day | ORAL | 12 refills | Status: DC

## 2016-08-04 NOTE — Progress Notes (Signed)
Patient reports she is doing well today. 

## 2016-08-04 NOTE — Progress Notes (Signed)
   PRENATAL VISIT NOTE  Subjective:  Daisy Marquez is a 26 y.o. G3P2002 at 1436w1d being seen today for ongoing prenatal care.  She is currently monitored for the following issues for this low-risk pregnancy and has Supervision of normal pregnancy, antepartum; Late prenatal care affecting pregnancy, antepartum; and Hx of preeclampsia, prior pregnancy, currently pregnant on her problem list.  Patient reports no complaints.  Contractions: Not present. Vag. Bleeding: None.  Movement: Present. Denies leaking of fluid.   The following portions of the patient's history were reviewed and updated as appropriate: allergies, current medications, past family history, past medical history, past social history, past surgical history and problem list. Problem list updated.  Objective:   Vitals:   08/04/16 1552  BP: 126/74  Pulse: 91  Weight: 195 lb 9.6 oz (88.7 kg)    Fetal Status: Fetal Heart Rate (bpm): 145 Fundal Height: 31 cm Movement: Present     General:  Alert, oriented and cooperative. Patient is in no acute distress.  Skin: Skin is warm and dry. No rash noted.   Cardiovascular: Normal heart rate noted  Respiratory: Normal respiratory effort, no problems with respiration noted  Abdomen: Soft, gravid, appropriate for gestational age. Pain/Pressure: Absent     Pelvic:  Cervical exam deferred        Extremities: Normal range of motion.  Edema: None  Mental Status: Normal mood and affect. Normal behavior. Normal judgment and thought content.   Assessment and Plan:  Pregnancy: G3P2002 at 1736w1d  1. Supervision of other normal pregnancy, antepartum     Doing well  2. Late prenatal care affecting pregnancy, antepartum     Initiated @27  weeks  3. Hx of preeclampsia, prior pregnancy, currently pregnant      - aspirin 81 MG chewable tablet; Chew 1 tablet (81 mg total) by mouth daily.  Dispense: 30 tablet; Refill: 12  Preterm labor symptoms and general obstetric precautions including but not  limited to vaginal bleeding, contractions, leaking of fluid and fetal movement were reviewed in detail with the patient. Please refer to After Visit Summary for other counseling recommendations.  Return in about 2 weeks (around 08/18/2016) for ROB.   Roe Coombsachelle A Marieclaire Bettenhausen, CNM

## 2016-08-18 ENCOUNTER — Encounter: Payer: Medicaid Other | Admitting: Certified Nurse Midwife

## 2016-08-18 ENCOUNTER — Ambulatory Visit (HOSPITAL_COMMUNITY)
Admission: RE | Admit: 2016-08-18 | Discharge: 2016-08-18 | Disposition: A | Discharge status: Home / Self Care | Payer: Medicaid Other | Source: Ambulatory Visit | Attending: Certified Nurse Midwife | Admitting: Certified Nurse Midwife

## 2016-08-18 ENCOUNTER — Encounter (HOSPITAL_COMMUNITY): Payer: Self-pay

## 2016-08-18 DIAGNOSIS — Z3480 Encounter for supervision of other normal pregnancy, unspecified trimester: Secondary | ICD-10-CM

## 2016-08-18 DIAGNOSIS — O0933 Supervision of pregnancy with insufficient antenatal care, third trimester: Secondary | ICD-10-CM | POA: Insufficient documentation

## 2016-08-18 DIAGNOSIS — O09293 Supervision of pregnancy with other poor reproductive or obstetric history, third trimester: Secondary | ICD-10-CM | POA: Insufficient documentation

## 2016-08-18 DIAGNOSIS — Z362 Encounter for other antenatal screening follow-up: Secondary | ICD-10-CM | POA: Insufficient documentation

## 2016-08-18 DIAGNOSIS — O0930 Supervision of pregnancy with insufficient antenatal care, unspecified trimester: Secondary | ICD-10-CM

## 2016-08-18 DIAGNOSIS — O99213 Obesity complicating pregnancy, third trimester: Secondary | ICD-10-CM | POA: Diagnosis not present

## 2016-08-18 DIAGNOSIS — O09299 Supervision of pregnancy with other poor reproductive or obstetric history, unspecified trimester: Secondary | ICD-10-CM

## 2016-08-18 DIAGNOSIS — Z3A33 33 weeks gestation of pregnancy: Secondary | ICD-10-CM | POA: Diagnosis not present

## 2016-08-22 ENCOUNTER — Other Ambulatory Visit: Payer: Self-pay | Admitting: Certified Nurse Midwife

## 2016-08-22 DIAGNOSIS — Z3480 Encounter for supervision of other normal pregnancy, unspecified trimester: Secondary | ICD-10-CM

## 2016-08-31 ENCOUNTER — Encounter: Payer: Self-pay | Admitting: Certified Nurse Midwife

## 2016-08-31 ENCOUNTER — Other Ambulatory Visit (HOSPITAL_COMMUNITY)
Admission: RE | Admit: 2016-08-31 | Discharge: 2016-08-31 | Disposition: A | Discharge status: Home / Self Care | Payer: Medicaid Other | Source: Ambulatory Visit | Attending: Certified Nurse Midwife | Admitting: Certified Nurse Midwife

## 2016-08-31 ENCOUNTER — Ambulatory Visit (INDEPENDENT_AMBULATORY_CARE_PROVIDER_SITE_OTHER): Payer: Medicaid Other | Admitting: Certified Nurse Midwife

## 2016-08-31 VITALS — BP 118/64 | HR 96 | Wt 198.6 lb

## 2016-08-31 DIAGNOSIS — Z3483 Encounter for supervision of other normal pregnancy, third trimester: Secondary | ICD-10-CM | POA: Diagnosis present

## 2016-08-31 DIAGNOSIS — Z3480 Encounter for supervision of other normal pregnancy, unspecified trimester: Secondary | ICD-10-CM

## 2016-08-31 NOTE — Progress Notes (Signed)
   PRENATAL VISIT NOTE  Subjective:  Daisy Marquez is a 26 y.o. G3P2002 at [redacted]w[redacted]d being seen today for ongoing prenatal care.  She is currently monitored for the following issues for this low-risk pregnancy and has Supervision of normal pregnancy, antepartum; Late prenatal care affecting pregnancy, antepartum; and Hx of preeclampsia, prior pregnancy, currently pregnant on her problem list.  Patient reports no complaints.  Contractions: Irregular. Vag. Bleeding: None.  Movement: Present. Denies leaking of fluid.   The following portions of the patient's history were reviewed and updated as appropriate: allergies, current medications, past family history, past medical history, past social history, past surgical history and problem list. Problem list updated.  Objective:   Vitals:   08/31/16 1611 08/31/16 1708  BP: (!) 143/74 118/64  Pulse: 96   Weight: 198 lb 9.6 oz (90.1 kg)     Fetal Status: Fetal Heart Rate (bpm): 150 Fundal Height: 35 cm Movement: Present     General:  Alert, oriented and cooperative. Patient is in no acute distress.  Skin: Skin is warm and dry. No rash noted.   Cardiovascular: Normal heart rate noted  Respiratory: Normal respiratory effort, no problems with respiration noted  Abdomen: Soft, gravid, appropriate for gestational age. Pain/Pressure: Absent     Pelvic:  Cervical exam deferred        Extremities: Normal range of motion.  Edema: Trace  Mental Status: Normal mood and affect. Normal behavior. Normal judgment and thought content.   Assessment and Plan:  Pregnancy: G3P2002 at [redacted]w[redacted]d  1. Supervision of other normal pregnancy, antepartum     Normotensive on recheck.  Hx of PIH: taking baby ASA.   - Strep Gp B NAA - Cervicovaginal ancillary only  Term labor symptoms and general obstetric precautions including but not limited to vaginal bleeding, contractions, leaking of fluid and fetal movement were reviewed in detail with the patient. Please refer to After  Visit Summary for other counseling recommendations.  Return in about 1 week (around 09/07/2016) for ROB.   Roe Coombs, CNM

## 2016-08-31 NOTE — Progress Notes (Signed)
Patient had a lot of contractions last night.

## 2016-09-01 LAB — OB RESULTS CONSOLE GBS: STREP GROUP B AG: NEGATIVE

## 2016-09-02 LAB — CERVICOVAGINAL ANCILLARY ONLY
BACTERIAL VAGINITIS: NEGATIVE
CANDIDA VAGINITIS: NEGATIVE
CHLAMYDIA, DNA PROBE: NEGATIVE
NEISSERIA GONORRHEA: NEGATIVE
Trichomonas: NEGATIVE

## 2016-09-02 LAB — STREP GP B NAA: STREP GROUP B AG: NEGATIVE

## 2016-09-08 ENCOUNTER — Encounter: Payer: Medicaid Other | Admitting: Certified Nurse Midwife

## 2016-09-19 ENCOUNTER — Telehealth: Payer: Self-pay

## 2016-09-19 NOTE — Telephone Encounter (Signed)
Pt no showed for appt on 4/12. Left message for pt to call office to reschedule.

## 2016-10-04 ENCOUNTER — Encounter: Payer: Self-pay | Admitting: Certified Nurse Midwife

## 2016-10-04 ENCOUNTER — Ambulatory Visit (INDEPENDENT_AMBULATORY_CARE_PROVIDER_SITE_OTHER): Payer: Medicaid Other | Admitting: Certified Nurse Midwife

## 2016-10-04 VITALS — BP 134/75 | HR 108 | Wt 206.4 lb

## 2016-10-04 DIAGNOSIS — O0930 Supervision of pregnancy with insufficient antenatal care, unspecified trimester: Secondary | ICD-10-CM

## 2016-10-04 DIAGNOSIS — Z3480 Encounter for supervision of other normal pregnancy, unspecified trimester: Secondary | ICD-10-CM

## 2016-10-04 DIAGNOSIS — Z3483 Encounter for supervision of other normal pregnancy, third trimester: Secondary | ICD-10-CM

## 2016-10-04 DIAGNOSIS — O09293 Supervision of pregnancy with other poor reproductive or obstetric history, third trimester: Secondary | ICD-10-CM

## 2016-10-04 DIAGNOSIS — O09299 Supervision of pregnancy with other poor reproductive or obstetric history, unspecified trimester: Secondary | ICD-10-CM

## 2016-10-04 DIAGNOSIS — O0933 Supervision of pregnancy with insufficient antenatal care, third trimester: Secondary | ICD-10-CM

## 2016-10-04 NOTE — Progress Notes (Signed)
Patient is doing well. She is ready to have her baby.

## 2016-10-04 NOTE — Progress Notes (Signed)
   PRENATAL VISIT NOTE  Subjective:  Daisy Marquez is a 26 y.o. G3P2002 at 3636w6d being seen today for ongoing prenatal care.  She is currently monitored for the following issues for this low-risk pregnancy and has Supervision of normal pregnancy, antepartum; Late prenatal care affecting pregnancy, antepartum; and Hx of preeclampsia, prior pregnancy, currently pregnant on her problem list.  Patient reports no complaints.  Contractions: Not present. Vag. Bleeding: None.  Movement: Present. Denies leaking of fluid.   The following portions of the patient's history were reviewed and updated as appropriate: allergies, current medications, past family history, past medical history, past social history, past surgical history and problem list. Problem list updated.  Objective:   Vitals:   10/04/16 1259  BP: 134/75  Pulse: (!) 108  Weight: 206 lb 6.4 oz (93.6 kg)    Fetal Status: Fetal Heart Rate (bpm): 152 Fundal Height: 38 cm Movement: Present  Presentation: Vertex  General:  Alert, oriented and cooperative. Patient is in no acute distress.  Skin: Skin is warm and dry. No rash noted.   Cardiovascular: Normal heart rate noted  Respiratory: Normal respiratory effort, no problems with respiration noted  Abdomen: Soft, gravid, appropriate for gestational age. Pain/Pressure: Present     Pelvic:  Cervical exam performed Dilation: Closed Effacement (%): 0 Station: -2  Extremities: Normal range of motion.  Edema: None  Mental Status: Normal mood and affect. Normal behavior. Normal judgment and thought content.   Assessment and Plan:  Pregnancy: G3P2002 at 9936w6d  1. Supervision of other normal pregnancy, antepartum     Doing well.  IOL scheduled.    2. Late prenatal care affecting pregnancy, antepartum     Initial care @27  weeks   3. Hx of preeclampsia, prior pregnancy, currently pregnant     Taking baby ASA.   Term labor symptoms and general obstetric precautions including but not limited  to vaginal bleeding, contractions, leaking of fluid and fetal movement were reviewed in detail with the patient. Please refer to After Visit Summary for other counseling recommendations.  Return in about 4 weeks (around 11/01/2016) for Postpartum.   Roe Coombsenney, Nakema Fake A, CNM

## 2016-10-07 ENCOUNTER — Telehealth (HOSPITAL_COMMUNITY): Payer: Self-pay | Admitting: *Deleted

## 2016-10-07 ENCOUNTER — Other Ambulatory Visit: Payer: Self-pay | Admitting: Advanced Practice Midwife

## 2016-10-07 NOTE — Telephone Encounter (Signed)
Preadmission screen  

## 2016-10-09 ENCOUNTER — Inpatient Hospital Stay (HOSPITAL_COMMUNITY): Payer: Medicaid Other | Admitting: Anesthesiology

## 2016-10-09 ENCOUNTER — Inpatient Hospital Stay (HOSPITAL_COMMUNITY)
Admission: AD | Admit: 2016-10-09 | Discharge: 2016-10-11 | DRG: 775 | Disposition: A | Discharge status: Home / Self Care | Payer: Medicaid Other | Source: Ambulatory Visit | Attending: Obstetrics & Gynecology | Admitting: Obstetrics & Gynecology

## 2016-10-09 ENCOUNTER — Encounter (HOSPITAL_COMMUNITY): Payer: Self-pay

## 2016-10-09 DIAGNOSIS — Z3A40 40 weeks gestation of pregnancy: Secondary | ICD-10-CM

## 2016-10-09 DIAGNOSIS — Z3493 Encounter for supervision of normal pregnancy, unspecified, third trimester: Secondary | ICD-10-CM | POA: Diagnosis present

## 2016-10-09 LAB — COMPREHENSIVE METABOLIC PANEL
ALK PHOS: 120 U/L (ref 38–126)
ALT: 13 U/L — ABNORMAL LOW (ref 14–54)
ANION GAP: 9 (ref 5–15)
AST: 18 U/L (ref 15–41)
Albumin: 2.7 g/dL — ABNORMAL LOW (ref 3.5–5.0)
BILIRUBIN TOTAL: 0.3 mg/dL (ref 0.3–1.2)
BUN: 10 mg/dL (ref 6–20)
CALCIUM: 8.2 mg/dL — AB (ref 8.9–10.3)
CO2: 19 mmol/L — ABNORMAL LOW (ref 22–32)
Chloride: 108 mmol/L (ref 101–111)
Creatinine, Ser: 0.43 mg/dL — ABNORMAL LOW (ref 0.44–1.00)
GFR calc non Af Amer: >60 mL/min (ref >60)
Glucose, Bld: 102 mg/dL — ABNORMAL HIGH (ref 65–99)
Potassium: 4 mmol/L (ref 3.5–5.1)
Sodium: 136 mmol/L (ref 135–145)
TOTAL PROTEIN: 6.4 g/dL — AB (ref 6.5–8.1)

## 2016-10-09 LAB — CBC
HCT: 35.6 % — ABNORMAL LOW (ref 36.0–46.0)
Hemoglobin: 11.4 g/dL — ABNORMAL LOW (ref 12.0–15.0)
MCH: 27.1 pg (ref 26.0–34.0)
MCHC: 32 g/dL (ref 30.0–36.0)
MCV: 84.6 fL (ref 78.0–100.0)
PLATELETS: 244 10*3/uL (ref 150–400)
RBC: 4.21 MIL/uL (ref 3.87–5.11)
RDW: 14.7 % (ref 11.5–15.5)
WBC: 11.3 10*3/uL — ABNORMAL HIGH (ref 4.0–10.5)

## 2016-10-09 LAB — TYPE AND SCREEN
ABO/RH(D): O POS
Antibody Screen: NEGATIVE

## 2016-10-09 LAB — PROTEIN / CREATININE RATIO, URINE
Creatinine, Urine: 66 mg/dL
PROTEIN CREATININE RATIO: 0.15 mg/mg{creat} (ref 0.00–0.15)
TOTAL PROTEIN, URINE: 10 mg/dL

## 2016-10-09 MED ORDER — SENNOSIDES-DOCUSATE SODIUM 8.6-50 MG PO TABS
2.0000 | ORAL_TABLET | ORAL | Status: DC
  Administered 2016-10-10: 2 via ORAL
  Filled 2016-10-09: qty 2

## 2016-10-09 MED ORDER — TERBUTALINE SULFATE 1 MG/ML IJ SOLN
0.2500 mg | Freq: Once | INTRAMUSCULAR | Status: DC | PRN
  Filled 2016-10-09: qty 1

## 2016-10-09 MED ORDER — OXYTOCIN BOLUS FROM INFUSION
500.0000 mL | Freq: Once | INTRAVENOUS | Status: AC
  Administered 2016-10-09: 500 mL via INTRAVENOUS

## 2016-10-09 MED ORDER — DIPHENHYDRAMINE HCL 25 MG PO CAPS: 25.0000 mg | ORAL_CAPSULE | Freq: Four times a day (QID) | ORAL | Status: DC | PRN

## 2016-10-09 MED ORDER — SODIUM CHLORIDE 0.9% FLUSH: 3.0000 mL | INTRAVENOUS | Status: DC | PRN

## 2016-10-09 MED ORDER — FENTANYL CITRATE (PF) 100 MCG/2ML IJ SOLN: 50.0000 ug | INTRAMUSCULAR | Status: DC | PRN

## 2016-10-09 MED ORDER — DIPHENHYDRAMINE HCL 50 MG/ML IJ SOLN: 12.5000 mg | INTRAMUSCULAR | Status: DC | PRN

## 2016-10-09 MED ORDER — LACTATED RINGERS IV SOLN: INTRAVENOUS | Status: DC

## 2016-10-09 MED ORDER — WITCH HAZEL-GLYCERIN EX PADS: 1.0000 "application " | MEDICATED_PAD | CUTANEOUS | Status: DC | PRN

## 2016-10-09 MED ORDER — TETANUS-DIPHTH-ACELL PERTUSSIS 5-2.5-18.5 LF-MCG/0.5 IM SUSP: 0.5000 mL | Freq: Once | INTRAMUSCULAR | Status: DC

## 2016-10-09 MED ORDER — OXYCODONE-ACETAMINOPHEN 5-325 MG PO TABS: 1.0000 | ORAL_TABLET | ORAL | Status: DC | PRN

## 2016-10-09 MED ORDER — LIDOCAINE HCL (PF) 1 % IJ SOLN
INTRAMUSCULAR | Status: DC | PRN
  Administered 2016-10-09 (×2): 5 mL via EPIDURAL

## 2016-10-09 MED ORDER — ACETAMINOPHEN 325 MG PO TABS: 650.0000 mg | ORAL_TABLET | ORAL | Status: DC | PRN

## 2016-10-09 MED ORDER — SIMETHICONE 80 MG PO CHEW: 80.0000 mg | CHEWABLE_TABLET | ORAL | Status: DC | PRN

## 2016-10-09 MED ORDER — IBUPROFEN 600 MG PO TABS
600.0000 mg | ORAL_TABLET | Freq: Four times a day (QID) | ORAL | Status: DC
  Administered 2016-10-10 – 2016-10-11 (×5): 600 mg via ORAL
  Filled 2016-10-09 (×4): qty 1

## 2016-10-09 MED ORDER — COCONUT OIL OIL: 1.0000 "application " | TOPICAL_OIL | Status: DC | PRN

## 2016-10-09 MED ORDER — FLEET ENEMA 7-19 GM/118ML RE ENEM: 1.0000 | ENEMA | RECTAL | Status: DC | PRN

## 2016-10-09 MED ORDER — ONDANSETRON HCL 4 MG/2ML IJ SOLN: 4.0000 mg | Freq: Four times a day (QID) | INTRAMUSCULAR | Status: DC | PRN

## 2016-10-09 MED ORDER — OXYCODONE-ACETAMINOPHEN 5-325 MG PO TABS
2.0000 | ORAL_TABLET | ORAL | Status: DC | PRN
Start: 2016-10-09 — End: 2016-10-09

## 2016-10-09 MED ORDER — LIDOCAINE HCL (PF) 1 % IJ SOLN
30.0000 mL | INTRAMUSCULAR | Status: DC | PRN
  Filled 2016-10-09: qty 30

## 2016-10-09 MED ORDER — EPHEDRINE 5 MG/ML INJ
10.0000 mg | INTRAVENOUS | Status: DC | PRN
Start: 2016-10-09 — End: 2016-10-09
  Filled 2016-10-09: qty 2

## 2016-10-09 MED ORDER — PRENATAL MULTIVITAMIN CH
1.0000 | ORAL_TABLET | Freq: Every day | ORAL | Status: DC
  Administered 2016-10-10: 1 via ORAL
  Filled 2016-10-09: qty 1

## 2016-10-09 MED ORDER — ONDANSETRON HCL 4 MG PO TABS: 4.0000 mg | ORAL_TABLET | ORAL | Status: DC | PRN

## 2016-10-09 MED ORDER — LACTATED RINGERS IV SOLN: 500.0000 mL | Freq: Once | INTRAVENOUS | Status: DC

## 2016-10-09 MED ORDER — OXYTOCIN 40 UNITS IN LACTATED RINGERS INFUSION - SIMPLE MED
1.0000 m[IU]/min | INTRAVENOUS | Status: DC
  Administered 2016-10-09: 2 m[IU]/min via INTRAVENOUS
  Filled 2016-10-09: qty 1000

## 2016-10-09 MED ORDER — ONDANSETRON HCL 4 MG/2ML IJ SOLN: 4.0000 mg | INTRAMUSCULAR | Status: DC | PRN

## 2016-10-09 MED ORDER — OXYTOCIN 40 UNITS IN LACTATED RINGERS INFUSION - SIMPLE MED: 2.5000 [IU]/h | INTRAVENOUS | Status: DC

## 2016-10-09 MED ORDER — SOD CITRATE-CITRIC ACID 500-334 MG/5ML PO SOLN: 30.0000 mL | ORAL | Status: DC | PRN

## 2016-10-09 MED ORDER — OXYTOCIN 40 UNITS IN LACTATED RINGERS INFUSION - SIMPLE MED: 2.5000 [IU]/h | INTRAVENOUS | Status: DC | PRN

## 2016-10-09 MED ORDER — DIBUCAINE 1 % RE OINT: 1.0000 "application " | TOPICAL_OINTMENT | RECTAL | Status: DC | PRN

## 2016-10-09 MED ORDER — SODIUM CHLORIDE 0.9 % IV SOLN: 250.0000 mL | INTRAVENOUS | Status: DC | PRN

## 2016-10-09 MED ORDER — SODIUM CHLORIDE 0.9% FLUSH: 3.0000 mL | Freq: Two times a day (BID) | INTRAVENOUS | Status: DC

## 2016-10-09 MED ORDER — PHENYLEPHRINE 40 MCG/ML (10ML) SYRINGE FOR IV PUSH (FOR BLOOD PRESSURE SUPPORT)
80.0000 ug | PREFILLED_SYRINGE | INTRAVENOUS | Status: DC | PRN
  Filled 2016-10-09: qty 5

## 2016-10-09 MED ORDER — PHENYLEPHRINE 40 MCG/ML (10ML) SYRINGE FOR IV PUSH (FOR BLOOD PRESSURE SUPPORT)
80.0000 ug | PREFILLED_SYRINGE | INTRAVENOUS | Status: DC | PRN
Start: 2016-10-09 — End: 2016-10-09
  Administered 2016-10-09 (×2): 80 ug via INTRAVENOUS
  Filled 2016-10-09: qty 10
  Filled 2016-10-09: qty 5

## 2016-10-09 MED ORDER — FENTANYL 2.5 MCG/ML BUPIVACAINE 1/10 % EPIDURAL INFUSION (WH - ANES)
14.0000 mL/h | INTRAMUSCULAR | Status: DC | PRN
  Administered 2016-10-09: 14 mL/h via EPIDURAL
  Filled 2016-10-09: qty 100

## 2016-10-09 MED ORDER — BENZOCAINE-MENTHOL 20-0.5 % EX AERO
1.0000 "application " | INHALATION_SPRAY | CUTANEOUS | Status: DC | PRN
  Filled 2016-10-09: qty 56

## 2016-10-09 MED ORDER — ZOLPIDEM TARTRATE 5 MG PO TABS: 5.0000 mg | ORAL_TABLET | Freq: Every evening | ORAL | Status: DC | PRN

## 2016-10-09 MED ORDER — EPHEDRINE 5 MG/ML INJ
10.0000 mg | INTRAVENOUS | Status: DC | PRN
  Filled 2016-10-09: qty 2

## 2016-10-09 MED ORDER — LACTATED RINGERS IV SOLN
500.0000 mL | INTRAVENOUS | Status: DC | PRN
  Administered 2016-10-09: 500 mL via INTRAVENOUS

## 2016-10-09 NOTE — H&P (Signed)
Daisy Marquez is a 26 y.o. female presenting for spontaneous onset of labor. She states contractions started this morning but got worse at 1600. Denies leaking of fluid or vaginal bleeding. Reports good fetal movement. She states she has a history of preeclampsia with her last pregnancy and had some elevated BP in the office that they have been "watching." Denies headache, visual changes, and epigastric pain at this time.   OB History    Gravida Para Term Preterm AB Living   3 2 2  0 0 2   SAB TAB Ectopic Multiple Live Births   0 0 0 0 2     Past Medical History:  Diagnosis Date  . Anemia   . UTI (lower urinary tract infection)    Past Surgical History:  Procedure Laterality Date  . NO PAST SURGERIES     Family History: family history is not on file. Social History:  reports that she has never smoked. She has never used smokeless tobacco. She reports that she does not drink alcohol or use drugs.     Maternal Diabetes: No Genetic Screening: Normal Maternal Ultrasounds/Referrals: Normal Fetal Ultrasounds or other Referrals:  None Maternal Substance Abuse:  No Significant Maternal Medications:  None Significant Maternal Lab Results:  None Other Comments:  None  Review of Systems  Constitutional: Negative.   Respiratory: Negative.  Negative for shortness of breath.   Cardiovascular: Negative.  Negative for chest pain.  Gastrointestinal: Positive for abdominal pain.  Neurological: Negative.    Maternal Medical History:  Reason for admission: Contractions.   Contractions: Onset was 3-5 hours ago.   Frequency: regular.   Perceived severity is moderate.    Fetal activity: Perceived fetal activity is normal.   Last perceived fetal movement was within the past hour.    Prenatal Complications - Diabetes: none.    Dilation: 7 Effacement (%): 80 Station: -1 Exam by:: caroline neill student cnm Blood pressure (!) 149/90, pulse (!) 102, temperature 98 F (36.7 C),  temperature source Oral, resp. rate 18, height 5\' 5"  (1.651 m), weight 207 lb (93.9 kg), last menstrual period 12/30/2015, SpO2 99 %, unknown if currently breastfeeding. Maternal Exam:  Uterine Assessment: Contraction strength is moderate.  Contraction frequency is regular.   Abdomen: Patient reports no abdominal tenderness. Fetal presentation: vertex  Introitus: Normal vulva. Normal vagina.  Ferning test: not done.  Nitrazine test: not done. Amniotic fluid character: not assessed.  Pelvis: adequate for delivery.   Cervix: Cervix evaluated by digital exam.     Fetal Exam Fetal Monitor Review: Mode: ultrasound.   Baseline rate: 140.  Variability: moderate (6-25 bpm).   Pattern: accelerations present and no decelerations.    Fetal State Assessment: Category I - tracings are normal.     Physical Exam  Nursing note and vitals reviewed. Constitutional: She appears well-developed and well-nourished.  HENT:  Head: Normocephalic and atraumatic.  Eyes: Conjunctivae are normal. No scleral icterus.  Cardiovascular: Normal rate and regular rhythm.   Respiratory: Effort normal. No respiratory distress.  GI: Soft. There is no tenderness.  Neurological: She is alert.  Skin: Skin is warm and dry.  Psychiatric: She has a normal mood and affect. Her behavior is normal. Judgment and thought content normal.    Prenatal labs: ABO, Rh: O/Positive/-- (02/08 1644) Antibody: Negative (02/08 1644) Rubella: 1.03 (02/08 1644) RPR: Non Reactive (02/22 1130)  HBsAg: Negative (02/08 1644)  HIV: Non Reactive (02/22 1130)  GBS: Negative (04/05 0000)   Assessment/Plan: IUP at term. Spontaneous  onset of labor. GBS neg. Hx of preeclampsia, some elevated BP today and no symptoms.  Admit to labor and delivery. Epidural for pain management. PIH labs pending. AROM with small amount of clear fluid. Manage expectantly.    Cleone SlimCaroline Neill SNM 10/09/2016, 6:31 PM

## 2016-10-09 NOTE — MAU Note (Signed)
Patient presents with contractions every 5 minutes today, had contractions during the night.

## 2016-10-09 NOTE — Anesthesia Pain Management Evaluation Note (Signed)
  CRNA Pain Management Visit Note  Patient: Daisy GrapesRuby Marquez, 26 y.o., female  "Hello I am a member of the anesthesia team at [facility]. We have an anesthesia team available at all times to provide care throughout the hospital, including epidural management and anesthesia for C-section. I don't know your plan for the delivery whether it a natural birth, water birth, IV sedation, nitrous supplementation, doula or epidural, but we want to meet your pain goals."   1.Was your pain managed to your expectations on prior hospitalizations?   Yes   2.What is your expectation for pain management during this hospitalization?     Epidural  3.How can we help you reach that goal? Epidural in place  Record the patient's initial score and the patient's pain goal.   Pain: 1  Pain Goal: 7 [facility] wants you to be able to say your pain was always managed very well.  Diem Pagnotta 10/09/2016

## 2016-10-09 NOTE — Anesthesia Procedure Notes (Addendum)
Epidural Patient location during procedure: OB Start time: 10/09/2016 6:51 PM End time: 10/09/2016 7:05 PM  Staffing Anesthesiologist: Heather RobertsSINGER, Avi Kerschner Performed: anesthesiologist and other anesthesia staff   Preanesthetic Checklist Completed: patient identified, site marked, pre-op evaluation, timeout performed, IV checked, risks and benefits discussed and monitors and equipment checked  Epidural Patient position: sitting Prep: DuraPrep Patient monitoring: heart rate, cardiac monitor, continuous pulse ox and blood pressure Approach: midline Location: L2-L3 Injection technique: LOR saline  Needle:  Needle type: Tuohy  Needle gauge: 17 G Needle length: 9 cm Needle insertion depth: 7 cm Catheter size: 20 Guage Catheter at skin depth: 11 cm Test dose: negative and Other  Assessment Events: blood not aspirated, injection not painful, no injection resistance and negative IV test  Additional Notes Informed consent obtained prior to proceeding including risk of failure, 1% risk of PDPH, risk of minor discomfort and bruising.  Discussed rare but serious complications including epidural abscess, permanent nerve injury, epidural hematoma.  Discussed alternatives to epidural analgesia and patient desires to proceed.  Timeout performed pre-procedure verifying patient name, procedure, and platelet count.  Patient tolerated procedure well.

## 2016-10-09 NOTE — MAU Note (Signed)
Notified Daisy Marquez CNM Student patient G3P2 7766w4d 5/80/-1 bloody show uncomfortable, BP elevated, handout given to Belenda CruiseHeather Mitchell RN Charge BS patient to go to room 169

## 2016-10-09 NOTE — Anesthesia Preprocedure Evaluation (Signed)
Anesthesia Evaluation  Patient identified by MRN, date of birth, ID band Patient awake    Reviewed: Allergy & Precautions, NPO status , Patient's Chart, lab work & pertinent test results  Airway Mallampati: II  TM Distance: >3 FB Neck ROM: Full    Dental no notable dental hx. (+) Dental Advisory Given   Pulmonary neg pulmonary ROS,    Pulmonary exam normal        Cardiovascular negative cardio ROS Normal cardiovascular exam     Neuro/Psych negative neurological ROS  negative psych ROS   GI/Hepatic negative GI ROS, Neg liver ROS,   Endo/Other  negative endocrine ROS  Renal/GU negative Renal ROS  negative genitourinary   Musculoskeletal negative musculoskeletal ROS (+)   Abdominal   Peds negative pediatric ROS (+)  Hematology negative hematology ROS (+)   Anesthesia Other Findings   Reproductive/Obstetrics (+) Pregnancy                             Anesthesia Physical Anesthesia Plan  ASA: II  Anesthesia Plan: Epidural   Post-op Pain Management:    Induction:   Airway Management Planned: Natural Airway  Additional Equipment:   Intra-op Plan:   Post-operative Plan:   Informed Consent: I have reviewed the patients History and Physical, chart, labs and discussed the procedure including the risks, benefits and alternatives for the proposed anesthesia with the patient or authorized representative who has indicated his/her understanding and acceptance.   Dental advisory given  Plan Discussed with: Anesthesiologist  Anesthesia Plan Comments:         Anesthesia Quick Evaluation  

## 2016-10-10 LAB — RPR: RPR Ser Ql: NONREACTIVE

## 2016-10-10 NOTE — Progress Notes (Signed)
Post Partum Day #1 Subjective: no complaints, up ad lib, voiding and tolerating PO  Objective: Blood pressure 116/61, pulse 72, temperature 97.6 F (36.4 C), temperature source Oral, resp. rate 18, height 5\' 5"  (1.651 m), weight 207 lb (93.9 kg), last menstrual period 12/30/2015, SpO2 99 %, unknown if currently breastfeeding.  Physical Exam:  General: alert, cooperative and no distress Lochia: appropriate Uterine Fundus: firm Incision: 1st deg lac, healing DVT Evaluation: No evidence of DVT seen on physical exam. No cords or calf tenderness. No significant calf/ankle edema.   Recent Labs  10/09/16 1815  HGB 11.4*  HCT 35.6*    Assessment/Plan: Plan for discharge tomorrow and Contraception IUD   LOS: 1 day   Roe CoombsRachelle A Denney, CNM 10/10/2016, 7:25 AM

## 2016-10-10 NOTE — Anesthesia Postprocedure Evaluation (Signed)
Anesthesia Post Note  Patient: Daisy Marquez  Procedure(s) Performed: * No procedures listed *  Patient location during evaluation: Mother Baby Anesthesia Type: Epidural Level of consciousness: awake Pain management: satisfactory to patient Vital Signs Assessment: post-procedure vital signs reviewed and stable Respiratory status: spontaneous breathing Cardiovascular status: stable Anesthetic complications: no        Last Vitals:  Vitals:   10/10/16 0015 10/10/16 0500  BP: 123/61 116/61  Pulse: 75 72  Resp: 18 18  Temp: 36.7 C 36.4 C    Last Pain:  Vitals:   10/10/16 0500  TempSrc: Oral  PainSc: 0-No pain   Pain Goal: Patients Stated Pain Goal: 4 (10/09/16 1817)               Cephus ShellingBURGER,Naomi Castrogiovanni

## 2016-10-10 NOTE — Plan of Care (Signed)
Problem: Pain Managment: Goal: General experience of comfort will improve Outcome: Completed/Met Date Met: 10/10/16 Patient complaining of mild abdominal cramping; however, declines additional pain medication. Encouraged patient to empty her bladder frequently and discussed the fact that she would notice more intense cramping this postpartum period compared to cramps with her first child. Encouraged patient to call RN if she desired additional pain medication.

## 2016-10-11 MED ORDER — IBUPROFEN 600 MG PO TABS: 600.0000 mg | ORAL_TABLET | Freq: Four times a day (QID) | ORAL | 2 refills | Status: DC

## 2016-10-11 NOTE — Plan of Care (Signed)
Problem: Education: Goal: Knowledge of condition will improve Discharge education reviewed with patient and significant other. Patient verbalizes understanding of information.    

## 2016-10-11 NOTE — Discharge Summary (Signed)
OB Discharge Summary     Patient Name: Daisy Marquez DOB: [birth year] MRN: [id number]  Date of admission: 10/09/2016 Delivering MD: Michaele OfferMUMAW, ELIZABETH WOODLAND   Date of discharge: 10/11/2016  Admitting diagnosis: 40WKS CTX Intrauterine pregnancy: 5239w4d     Secondary diagnosis:  Active Problems:   Normal labor  Additional problems: Hx of Preeclampsia, normotensive this pregnancy     Discharge diagnosis: Term Pregnancy Delivered                                                                                                Post partum procedures:none  Augmentation: AROM and Pitocin  Complications: None  Hospital course:  Onset of Labor With Vaginal Delivery     26 y.o. yo [id number] at 7339w4d was admitted in Active Labor on 10/09/2016. Patient had an uncomplicated labor course as follows:  Membrane Rupture Time/Date: 6:27 PM ,10/09/2016   Intrapartum Procedures: Episiotomy: None [1]                                         Lacerations:  1st degree [2]  Patient had a delivery of a Viable infant. 10/09/2016  Information for the patient's newborn:  Jacques NavyMosqueda, Boy Statia [[id number]][[id number]]  Delivery Method: VBAC, Spontaneous (Filed from Delivery Summary)    Pateint had an uncomplicated postpartum course.  She is ambulating, tolerating a regular diet, passing flatus, and urinating well. Patient is discharged home in stable condition on 10/11/16.   Physical exam  Vitals:   10/10/16 0500 10/10/16 1000 10/10/16 1800 10/11/16 0619  BP: 116/61 120/63 (!) 99/53 (!) 122/47  Pulse: 72 75 72 62  Resp: 18 16 18 14   Temp: 97.6 F (36.4 C) 98 F (36.7 C) 97.5 F (36.4 C) 97.8 F (36.6 C)  TempSrc: Oral Oral Oral Oral  SpO2: 99% 98%    Weight:      Height:       General: alert, cooperative and no distress Lochia: appropriate Uterine Fundus: firm Incision: N/A DVT Evaluation: No evidence of DVT seen on physical exam. No cords or calf tenderness. No significant calf/ankle edema. Labs: Lab  Results  Component Value Date   WBC 11.3 (H) 10/09/2016   HGB 11.4 (L) 10/09/2016   HCT 35.6 (L) 10/09/2016   MCV 84.6 10/09/2016   PLT 244 10/09/2016   CMP Latest Ref Rng & Units 10/09/2016  Glucose 65 - 99 mg/dL 696(E102(H)  BUN 6 - 20 mg/dL 10  Creatinine 9.520.44 - 8.411.00 mg/dL 3.24(M0.43(L)  Sodium 010135 - 272145 mmol/L 136  Potassium 3.5 - 5.1 mmol/L 4.0  Chloride 101 - 111 mmol/L 108  CO2 22 - 32 mmol/L 19(L)  Calcium 8.9 - 10.3 mg/dL 8.2(L)  Total Protein 6.5 - 8.1 g/dL 6.4(L)  Total Bilirubin 0.3 - 1.2 mg/dL 0.3  Alkaline Phos 38 - 126 U/L 120  AST 15 - 41 U/L 18  ALT 14 - 54 U/L 13(L)    Discharge instruction: per After Visit Summary and "Baby and Me Booklet".  After  visit meds:  Allergies as of 10/11/2016   No Known Allergies     Medication List    TAKE these medications   ibuprofen 600 MG tablet Commonly known as:  ADVIL,MOTRIN Take 1 tablet (600 mg total) by mouth every 6 (six) hours.   meclizine 25 MG tablet Commonly known as:  ANTIVERT Take 1 tablet (25 mg total) by mouth 3 (three) times daily as needed for dizziness.   multivitamin-prenatal 27-0.8 MG Tabs tablet Take 1 tablet by mouth daily at 12 noon.       Diet: routine diet  Activity: Advance as tolerated. Pelvic rest for 6 weeks.   Outpatient follow up:4 weeks Follow up Appt: Future Appointments Date Time Provider Department Center  11/09/2016 9:00 AM Orvilla Cornwall A, CNM [facility] None   Follow up Visit:No Follow-up on file.  Postpartum contraception: IUD Mirena  Newborn Data: Live born female  Birth Weight: 8 lb 8.9 oz (3880 g) APGAR: 8, 9  Baby Feeding: Bottle Disposition:home with mother   10/11/2016 Roe Coombs, CNM

## 2016-10-12 ENCOUNTER — Inpatient Hospital Stay (HOSPITAL_COMMUNITY): Admission: RE | Admit: 2016-10-12 | Payer: Medicaid Other | Source: Ambulatory Visit

## 2016-11-09 ENCOUNTER — Ambulatory Visit: Payer: Medicaid Other | Admitting: Certified Nurse Midwife

## 2017-05-30 NOTE — L&D Delivery Note (Signed)
Delivery Note At 3:29 PM a viable female was delivered via Vaginal, Spontaneous (Presentation: LOT ;LOA  ).  APGAR: 9, 9; weight pending  .   Placenta status:nitact ,spontaneously removed .  Cord:three vessels  with the following complications: none.  Cord pH: not sent  Anesthesia:  epidural Episiotomy: None Lacerations: None Suture Repair: N/A Est. Blood Loss (mL):  100mL  Mom to postpartum.  Baby to Couplet care / Skin to Skin.  Patient delivered prior to placement of foley. 300 mL urine drained with in and out cath.  BTL papers signed 09/20/17. Discussed with Dr. Adrian BlackwaterStinson. Patient scheduled for BTL at 1700 today.   Calvert CantorSamantha C Arlayne Liggins, CNM 12/12/2017, 3:54 PM

## 2017-09-07 ENCOUNTER — Ambulatory Visit: Payer: Self-pay

## 2017-09-07 DIAGNOSIS — Z3200 Encounter for pregnancy test, result unknown: Secondary | ICD-10-CM

## 2017-09-07 DIAGNOSIS — Z3201 Encounter for pregnancy test, result positive: Secondary | ICD-10-CM | POA: Diagnosis not present

## 2017-09-07 LAB — POCT URINE PREGNANCY: PREG TEST UR: POSITIVE — AB

## 2017-09-07 NOTE — Progress Notes (Signed)
..   [name] presents today for UPT. She has no unusual complaints. LMP:03-03-17    OBJECTIVE: Appears well, in no apparent distress.  OB History    Gravida  3   Para  3   Term  3   Preterm  0   AB  0   Living  3     SAB  0   TAB  0   Ectopic  0   Multiple  0   Live Births  3          Home UPT Result:Positive    In-Office UPT result:Positive I have reviewed the patient's medical, obstetrical, social, and family histories, and medications.   ASSESSMENT: Positive pregnancy test  PLAN Prenatal care to be completed at: [facility]

## 2017-09-07 NOTE — Progress Notes (Signed)
I have reviewed the chart and agree with nursing staff's documentation of this patient's encounter.  Rylend Pietrzak, MD 09/07/2017 4:16 PM    

## 2017-09-20 ENCOUNTER — Other Ambulatory Visit (HOSPITAL_COMMUNITY)
Admission: RE | Admit: 2017-09-20 | Discharge: 2017-09-20 | Disposition: A | Discharge status: Home / Self Care | Payer: Medicaid Other | Source: Ambulatory Visit | Attending: Certified Nurse Midwife | Admitting: Certified Nurse Midwife

## 2017-09-20 ENCOUNTER — Encounter: Payer: Self-pay | Admitting: Certified Nurse Midwife

## 2017-09-20 ENCOUNTER — Other Ambulatory Visit: Payer: Medicaid Other

## 2017-09-20 ENCOUNTER — Ambulatory Visit (INDEPENDENT_AMBULATORY_CARE_PROVIDER_SITE_OTHER): Payer: Medicaid Other | Admitting: Certified Nurse Midwife

## 2017-09-20 VITALS — BP 138/76 | HR 92 | Wt 203.8 lb

## 2017-09-20 DIAGNOSIS — O0933 Supervision of pregnancy with insufficient antenatal care, third trimester: Secondary | ICD-10-CM

## 2017-09-20 DIAGNOSIS — Z3A28 28 weeks gestation of pregnancy: Secondary | ICD-10-CM | POA: Diagnosis not present

## 2017-09-20 DIAGNOSIS — O09299 Supervision of pregnancy with other poor reproductive or obstetric history, unspecified trimester: Secondary | ICD-10-CM

## 2017-09-20 DIAGNOSIS — Z3483 Encounter for supervision of other normal pregnancy, third trimester: Secondary | ICD-10-CM | POA: Insufficient documentation

## 2017-09-20 DIAGNOSIS — O09293 Supervision of pregnancy with other poor reproductive or obstetric history, third trimester: Secondary | ICD-10-CM

## 2017-09-20 DIAGNOSIS — O0930 Supervision of pregnancy with insufficient antenatal care, unspecified trimester: Secondary | ICD-10-CM

## 2017-09-20 DIAGNOSIS — Z3480 Encounter for supervision of other normal pregnancy, unspecified trimester: Secondary | ICD-10-CM | POA: Insufficient documentation

## 2017-09-20 MED ORDER — PRENATE PIXIE 10-0.6-0.4-200 MG PO CAPS: 1.0000 | ORAL_CAPSULE | Freq: Every day | ORAL | 12 refills | Status: AC

## 2017-09-20 MED ORDER — ASPIRIN 81 MG PO CHEW
81.0000 mg | CHEWABLE_TABLET | Freq: Every day | ORAL | 12 refills | Status: DC
Start: 2017-09-20 — End: 2017-12-13

## 2017-09-20 NOTE — Progress Notes (Signed)
Patient is in the office for initial ob visit, unplanned pregnancy, pt is married to FOB. Pt previously stated that she is late to care because "nothing ever happens until the end".

## 2017-09-20 NOTE — Progress Notes (Signed)
Subjective:   Daisy Marquez is a 27 y.o. G4P3003 at [redacted]w[redacted]d by LMP being seen today for her first obstetrical visit.  Her obstetrical history is significant for pregnancy induced hypertension, obesity. Patient does intend to breast feed. Pregnancy history fully reviewed.  Patient reports no complaints.  HISTORY: OB History  Gravida Para Term Preterm AB Living  4 3 3  0 0 3  SAB TAB Ectopic Multiple Live Births  0 0 0 0 3    # Outcome Date GA Lbr Len/2nd Weight Sex Delivery Anes PTL Lv  4 Current           3 Term 10/09/16 [redacted]w[redacted]d 05:07 / 00:06 8 lb 8.9 oz (3.88 kg) M VBAC EPI  LIV     Birth Comments: WNL     Name: Oppenheimer,BOY Arianni     Apgar1: 8  Apgar5: 9  2 Term 01/22/14 [redacted]w[redacted]d 03:21 / 00:21 6 lb 15 oz (3.147 kg) F Vag-Spont EPI  LIV     Name: Kina,GIRL Britini     Apgar1: 9  Apgar5: 9  1 Term 12/13/12 [redacted]w[redacted]d 15:02 / 00:28 7 lb (3.175 kg) M Vag-Spont EPI  LIV     Name: Helmers,BOY Lorrinda     Apgar1: 9  Apgar5: 9    Last pap smear was done 12/2016 and was normal  Past Medical History:  Diagnosis Date  . Anemia   . UTI (lower urinary tract infection)    Past Surgical History:  Procedure Laterality Date  . NO PAST SURGERIES     Family History  Problem Relation Age of Onset  . Alcohol abuse Neg Hx    Social History   Tobacco Use  . Smoking status: Never Smoker  . Smokeless tobacco: Never Used  Substance Use Topics  . Alcohol use: No  . Drug use: No   No Known Allergies Current Outpatient Medications on File Prior to Visit  Medication Sig Dispense Refill  . Prenatal Vit-Fe Fumarate-FA (MULTIVITAMIN-PRENATAL) 27-0.8 MG TABS tablet Take 1 tablet by mouth daily at 12 noon.     No current facility-administered medications on file prior to visit.     Review of Systems Pertinent items noted in HPI and remainder of comprehensive ROS otherwise negative.  Exam   Vitals:   09/20/17 0927 09/20/17 0933  BP: 135/78 138/76  Pulse: 87 92  Weight: 203 lb 12.8 oz (92.4  kg)    Fetal Heart Rate (bpm): 151; doppler  Uterus:  Fundal Height: 26 cm  Pelvic Exam: Perineum: no hemorrhoids, normal perineum   Vulva: normal external genitalia, no lesions   Vagina:  normal mucosa, normal discharge   Cervix: no lesions and normal    Adnexa: normal adnexa and no mass, fullness, tenderness   Bony Pelvis: average  System: General: well-developed, well-nourished female in no acute distress   Breast:  normal appearance, no masses or tenderness   Skin: normal coloration and turgor, no rashes   Neurologic: oriented, normal, negative, normal mood   Extremities: normal strength, tone, and muscle mass, ROM of all joints is normal   HEENT PERRLA, extraocular movement intact and sclera clear, anicteric   Mouth/Teeth mucous membranes moist, pharynx normal without lesions and dental hygiene good   Neck supple and no masses   Cardiovascular: regular rate and rhythm   Respiratory:  no respiratory distress, normal breath sounds   Abdomen: soft, non-tender; bowel sounds normal; no masses,  no organomegaly     Assessment:   Pregnancy: [id number]  Patient Active Problem List   Diagnosis Date Noted  . Supervision of other normal pregnancy, antepartum 09/20/2017  . Hx of preeclampsia, prior pregnancy, currently pregnant 07/21/2016  . Late prenatal care affecting pregnancy, antepartum 07/07/2016     Plan:  1. Supervision of other normal pregnancy, antepartum       - Cervicovaginal ancillary only - Obstetric Panel, Including HIV - Culture, OB Urine - HgB A1c - Vitamin D (25 hydroxy) - Cystic Fibrosis Mutation 97 - Genetic Screening - Glucose Tolerance, 2 Hours w/1 Hour - US MFM OB DETAIL +14 WK; Future - Prenat-FeAsp-Meth-FA-DHA w/o A (PRENATE PIXIE) 10-0.6-0.4-200 MG CAPS; Take 1 tablet by mouth daily.  Dispense: 30 capsule; Refill: 12  2. Late prenatal care affecting pregnancy, antepartum    - US MFM OB DETAIL +14 WK; Future  3. Hx of preeclampsia, prior pregnancy,  currently pregnant    - aspirin 81 MG chewable tablet; Chew 1 tablet (81 mg total) by mouth daily.  Dispense: 30 tablet; Refill: 12 - US MFM OB DETAIL +14 WK; Future   Initial labs drawn. Continue prenatal vitamins. Genetic Screening discussed, NIPS: ordered. Ultrasound discussed; fetal anatomic survey: ordered. Problem list reviewed and updated. The nature of [facility] with multiple MDs and other Advanced Practice Providers was explained to patient; also emphasized that residents, students are part of our team. Routine obstetric precautions reviewed. Return in about 2 weeks (around 10/04/2017) for ROB.     Orvilla Cornwallachelle Jacqualynn Parco, CNM [facility], [facility]

## 2017-09-21 LAB — CERVICOVAGINAL ANCILLARY ONLY
Bacterial vaginitis: NEGATIVE
CHLAMYDIA, DNA PROBE: NEGATIVE
Candida vaginitis: NEGATIVE
Neisseria Gonorrhea: NEGATIVE
Trichomonas: NEGATIVE

## 2017-09-22 LAB — VITAMIN D 25 HYDROXY (VIT D DEFICIENCY, FRACTURES): Vit D, 25-Hydroxy: 31.3 ng/mL (ref 30.0–100.0)

## 2017-09-22 LAB — CULTURE, OB URINE

## 2017-09-22 LAB — OBSTETRIC PANEL, INCLUDING HIV
ANTIBODY SCREEN: NEGATIVE
BASOS: 0 %
Basophils Absolute: 0 10*3/uL (ref 0.0–0.2)
EOS (ABSOLUTE): 0.2 10*3/uL (ref 0.0–0.4)
Eos: 3 %
HEMATOCRIT: 36.4 % (ref 34.0–46.6)
HIV SCREEN 4TH GENERATION: NONREACTIVE
Hemoglobin: 11.9 g/dL (ref 11.1–15.9)
Hepatitis B Surface Ag: NEGATIVE
Immature Grans (Abs): 0.1 10*3/uL (ref 0.0–0.1)
Immature Granulocytes: 1 %
Lymphocytes Absolute: 1.4 10*3/uL (ref 0.7–3.1)
Lymphs: 15 %
MCH: 29.7 pg (ref 26.6–33.0)
MCHC: 32.7 g/dL (ref 31.5–35.7)
MCV: 91 fL (ref 79–97)
MONOCYTES: 7 %
MONOS ABS: 0.6 10*3/uL (ref 0.1–0.9)
NEUTROS ABS: 6.8 10*3/uL (ref 1.4–7.0)
Neutrophils: 74 %
Platelets: 272 10*3/uL (ref 150–379)
RBC: 4.01 x10E6/uL (ref 3.77–5.28)
RDW: 14.1 % (ref 12.3–15.4)
RPR Ser Ql: NONREACTIVE
RUBELLA: 0.92 {index} — AB (ref >0.99)
Rh Factor: POSITIVE
WBC: 9.1 10*3/uL (ref 3.4–10.8)

## 2017-09-22 LAB — HEMOGLOBIN A1C
Est. average glucose Bld gHb Est-mCnc: 100 mg/dL
Hgb A1c MFr Bld: 5.1 % (ref 4.8–5.6)

## 2017-09-22 LAB — GLUCOSE TOLERANCE, 2 HOURS W/ 1HR
GLUCOSE, FASTING: 86 mg/dL (ref 65–91)
Glucose, 1 hour: 122 mg/dL (ref 65–179)
Glucose, 2 hour: 72 mg/dL (ref 65–152)

## 2017-09-22 LAB — URINE CULTURE, OB REFLEX

## 2017-09-25 ENCOUNTER — Other Ambulatory Visit: Payer: Self-pay | Admitting: Certified Nurse Midwife

## 2017-09-25 DIAGNOSIS — Z283 Underimmunization status: Secondary | ICD-10-CM

## 2017-09-25 DIAGNOSIS — Z3480 Encounter for supervision of other normal pregnancy, unspecified trimester: Secondary | ICD-10-CM

## 2017-09-25 DIAGNOSIS — O09899 Supervision of other high risk pregnancies, unspecified trimester: Secondary | ICD-10-CM

## 2017-09-25 DIAGNOSIS — O9989 Other specified diseases and conditions complicating pregnancy, childbirth and the puerperium: Secondary | ICD-10-CM

## 2017-09-27 ENCOUNTER — Other Ambulatory Visit: Payer: Self-pay | Admitting: Certified Nurse Midwife

## 2017-09-27 ENCOUNTER — Encounter (HOSPITAL_COMMUNITY): Payer: Self-pay

## 2017-09-27 DIAGNOSIS — Z3480 Encounter for supervision of other normal pregnancy, unspecified trimester: Secondary | ICD-10-CM

## 2017-09-27 LAB — CYSTIC FIBROSIS MUTATION 97: Interpretation: NOT DETECTED

## 2017-09-28 ENCOUNTER — Ambulatory Visit (HOSPITAL_COMMUNITY)
Admission: RE | Admit: 2017-09-28 | Discharge: 2017-09-28 | Disposition: A | Discharge status: Home / Self Care | Payer: Medicaid Other | Source: Ambulatory Visit | Attending: Certified Nurse Midwife | Admitting: Certified Nurse Midwife

## 2017-09-28 ENCOUNTER — Encounter (HOSPITAL_COMMUNITY): Payer: Self-pay

## 2017-09-28 DIAGNOSIS — Z3A29 29 weeks gestation of pregnancy: Secondary | ICD-10-CM | POA: Diagnosis not present

## 2017-09-28 DIAGNOSIS — O09293 Supervision of pregnancy with other poor reproductive or obstetric history, third trimester: Secondary | ICD-10-CM | POA: Diagnosis not present

## 2017-09-28 DIAGNOSIS — Z363 Encounter for antenatal screening for malformations: Secondary | ICD-10-CM | POA: Insufficient documentation

## 2017-09-28 DIAGNOSIS — Z3480 Encounter for supervision of other normal pregnancy, unspecified trimester: Secondary | ICD-10-CM

## 2017-09-28 DIAGNOSIS — O09299 Supervision of pregnancy with other poor reproductive or obstetric history, unspecified trimester: Secondary | ICD-10-CM

## 2017-09-28 DIAGNOSIS — O0930 Supervision of pregnancy with insufficient antenatal care, unspecified trimester: Secondary | ICD-10-CM

## 2017-09-28 DIAGNOSIS — O0933 Supervision of pregnancy with insufficient antenatal care, third trimester: Secondary | ICD-10-CM | POA: Diagnosis present

## 2017-09-28 HISTORY — DX: Gestational (pregnancy-induced) hypertension without significant proteinuria, unspecified trimester: O13.9

## 2017-09-29 ENCOUNTER — Other Ambulatory Visit (HOSPITAL_COMMUNITY): Payer: Self-pay | Admitting: *Deleted

## 2017-09-29 ENCOUNTER — Other Ambulatory Visit: Payer: Self-pay | Admitting: Certified Nurse Midwife

## 2017-09-29 DIAGNOSIS — Z362 Encounter for other antenatal screening follow-up: Secondary | ICD-10-CM

## 2017-10-04 ENCOUNTER — Ambulatory Visit (INDEPENDENT_AMBULATORY_CARE_PROVIDER_SITE_OTHER): Payer: Medicaid Other | Admitting: Certified Nurse Midwife

## 2017-10-04 ENCOUNTER — Encounter: Payer: Self-pay | Admitting: Family Medicine

## 2017-10-04 ENCOUNTER — Encounter: Payer: Self-pay | Admitting: Certified Nurse Midwife

## 2017-10-04 ENCOUNTER — Other Ambulatory Visit: Payer: Self-pay

## 2017-10-04 VITALS — BP 109/69 | HR 88 | Wt 208.3 lb

## 2017-10-04 DIAGNOSIS — O99210 Obesity complicating pregnancy, unspecified trimester: Secondary | ICD-10-CM

## 2017-10-04 DIAGNOSIS — Z3480 Encounter for supervision of other normal pregnancy, unspecified trimester: Secondary | ICD-10-CM

## 2017-10-04 DIAGNOSIS — O9989 Other specified diseases and conditions complicating pregnancy, childbirth and the puerperium: Secondary | ICD-10-CM

## 2017-10-04 DIAGNOSIS — Z283 Underimmunization status: Secondary | ICD-10-CM

## 2017-10-04 DIAGNOSIS — O09899 Supervision of other high risk pregnancies, unspecified trimester: Secondary | ICD-10-CM

## 2017-10-04 DIAGNOSIS — E669 Obesity, unspecified: Secondary | ICD-10-CM | POA: Insufficient documentation

## 2017-10-04 DIAGNOSIS — O09299 Supervision of pregnancy with other poor reproductive or obstetric history, unspecified trimester: Secondary | ICD-10-CM

## 2017-10-04 NOTE — Progress Notes (Signed)
   PRENATAL VISIT NOTE  Subjective:  Daisy Marquez is a 27 y.o. G4P3003 at 10w5dbeing seen today for ongoing prenatal care.  She is currently monitored for the following issues for this low-risk pregnancy and has Late prenatal care affecting pregnancy, antepartum; Hx of preeclampsia, prior pregnancy, currently pregnant; Supervision of other normal pregnancy, antepartum; Rubella non-immune status, antepartum; Obesity (BMI 30.0-34.9); and Obesity affecting pregnancy, antepartum on their problem list.  Patient reports no bleeding, no contractions, no cramping, no leaking and leg cramps at night, discussed exercises and increased potassium intake, denies HA.  Contractions: Not present. Vag. Bleeding: None.  Movement: Present. Denies leaking of fluid.   The following portions of the patient's history were reviewed and updated as appropriate: allergies, current medications, past family history, past medical history, past social history, past surgical history and problem list. Problem list updated.  Objective:   Vitals:   10/04/17 1532  BP: 109/69  Pulse: 88  Weight: 208 lb 4.8 oz (94.5 kg)    Fetal Status: Fetal Heart Rate (bpm): 135; doppler Fundal Height: 30 cm Movement: Present     General:  Alert, oriented and cooperative. Patient is in no acute distress.  Skin: Skin is warm and dry. No rash noted.   Cardiovascular: Normal heart rate noted  Respiratory: Normal respiratory effort, no problems with respiration noted  Abdomen: Soft, gravid, appropriate for gestational age.  Pain/Pressure: Absent     Pelvic: Cervical exam deferred        Extremities: Normal range of motion.  Edema: Trace  Mental Status: Normal mood and affect. Normal behavior. Normal judgment and thought content.   Assessment and Plan:  Pregnancy: G4P3003 at 325w5d1. Supervision of other normal pregnancy, antepartum     Normal pregnancy discomforts discussed.  Has f/u anatomy USKoreacheduled.   2. Rubella non-immune  status, antepartum     MMR postpartum  3. Obesity affecting pregnancy, antepartum     5 lb weight gain this pregnancy estimated  4. Hx of preeclampsia, prior pregnancy, currently pregnant     Normotensive today, taking baby ASA.  Preterm labor symptoms and general obstetric precautions including but not limited to vaginal bleeding, contractions, leaking of fluid and fetal movement were reviewed in detail with the patient. Please refer to After Visit Summary for other counseling recommendations.  Return in about 2 weeks (around 10/18/2017) for ROB.  Future Appointments  Date Time Provider [place]5/22/2019 10:30 AM DeMorene CrockerCNM [facility] None  10/26/2017  2:45 PM WHWinter GardenSKorea [place]enney, CNM

## 2017-10-04 NOTE — Progress Notes (Signed)
ROB.  C/o cramping in both calves.

## 2017-10-18 ENCOUNTER — Encounter: Payer: Self-pay | Admitting: Certified Nurse Midwife

## 2017-10-18 ENCOUNTER — Ambulatory Visit (INDEPENDENT_AMBULATORY_CARE_PROVIDER_SITE_OTHER): Payer: Medicaid Other | Admitting: Certified Nurse Midwife

## 2017-10-18 VITALS — BP 117/75 | HR 84 | Wt 199.6 lb

## 2017-10-18 DIAGNOSIS — Z3483 Encounter for supervision of other normal pregnancy, third trimester: Secondary | ICD-10-CM

## 2017-10-18 DIAGNOSIS — O0930 Supervision of pregnancy with insufficient antenatal care, unspecified trimester: Secondary | ICD-10-CM

## 2017-10-18 DIAGNOSIS — O0933 Supervision of pregnancy with insufficient antenatal care, third trimester: Secondary | ICD-10-CM

## 2017-10-18 DIAGNOSIS — O99210 Obesity complicating pregnancy, unspecified trimester: Secondary | ICD-10-CM

## 2017-10-18 DIAGNOSIS — O9989 Other specified diseases and conditions complicating pregnancy, childbirth and the puerperium: Principal | ICD-10-CM

## 2017-10-18 DIAGNOSIS — Z283 Underimmunization status: Secondary | ICD-10-CM

## 2017-10-18 DIAGNOSIS — O09299 Supervision of pregnancy with other poor reproductive or obstetric history, unspecified trimester: Secondary | ICD-10-CM

## 2017-10-18 DIAGNOSIS — O09293 Supervision of pregnancy with other poor reproductive or obstetric history, third trimester: Secondary | ICD-10-CM

## 2017-10-18 DIAGNOSIS — O09899 Supervision of other high risk pregnancies, unspecified trimester: Secondary | ICD-10-CM

## 2017-10-18 DIAGNOSIS — Z3480 Encounter for supervision of other normal pregnancy, unspecified trimester: Secondary | ICD-10-CM

## 2017-10-18 DIAGNOSIS — O99213 Obesity complicating pregnancy, third trimester: Secondary | ICD-10-CM

## 2017-10-18 NOTE — Progress Notes (Signed)
Patient reports good fetal movement, denies pain. Pt states that she started eating healthier by cutting out soda, carbs and a lot of sugars, which has contributed to weight loss between this visit and the last.

## 2017-10-18 NOTE — Progress Notes (Signed)
   PRENATAL VISIT NOTE  Subjective:  Daisy Marquez is a 27 y.o. G4P3003 at 27w5dbeing seen today for ongoing prenatal care.  She is currently monitored for the following issues for this low-risk pregnancy and has Late prenatal care affecting pregnancy, antepartum; Hx of preeclampsia, prior pregnancy, currently pregnant; Supervision of other normal pregnancy, antepartum; Rubella non-immune status, antepartum; Obesity (BMI 30.0-34.9); and Obesity affecting pregnancy, antepartum on their problem list.  Patient reports no complaints.  Contractions: Not present. Vag. Bleeding: None.  Movement: Present. Denies leaking of fluid.   The following portions of the patient's history were reviewed and updated as appropriate: allergies, current medications, past family history, past medical history, past social history, past surgical history and problem list. Problem list updated.  Objective:   Vitals:   10/18/17 1013  BP: 117/75  Pulse: 84  Weight: 199 lb 9.6 oz (90.5 kg)    Fetal Status: Fetal Heart Rate (bpm): 135-150; doppler Fundal Height: 31 cm Movement: Present     General:  Alert, oriented and cooperative. Patient is in no acute distress.  Skin: Skin is warm and dry. No rash noted.   Cardiovascular: Normal heart rate noted  Respiratory: Normal respiratory effort, no problems with respiration noted  Abdomen: Soft, gravid, appropriate for gestational age.  Pain/Pressure: Absent     Pelvic: Cervical exam deferred        Extremities: Normal range of motion.  Edema: Trace  Mental Status: Normal mood and affect. Normal behavior. Normal judgment and thought content.   Assessment and Plan:  Pregnancy: G4P3003 at 315w5d1. Supervision of other normal pregnancy, antepartum     Doing well.  Has f/u USKoreardered for small HC <3%, MFM thinks is consitutional.  Overall growth 55%.    2. Rubella non-immune status, antepartum     MMR postpartum  3. Obesity affecting pregnancy, antepartum     Has  lost 3 lbs in 2 weeks, is doing a healthy diet and cut out carbs/sugary drinks and is eating fruits/vegitables.   4. Late prenatal care affecting pregnancy, antepartum    _0  wks  5. Hx of preeclampsia, prior pregnancy, currently pregnant     Normal range blood pressures.    Preterm labor symptoms and general obstetric precautions including but not limited to vaginal bleeding, contractions, leaking of fluid and fetal movement were reviewed in detail with the patient. Please refer to After Visit Summary for other counseling recommendations.  Return in about 2 weeks (around 11/01/2017) for ROB.  Future Appointments  Date Time Provider [place]5/30/2019  2:45 PM WHFerndaleSKorea [facility]  11/01/2017  2:45 PM Leanette Eutsler, RaCharna ArcherCNM [facility]    RaMorene CrockerCNM

## 2017-10-26 ENCOUNTER — Other Ambulatory Visit (HOSPITAL_COMMUNITY): Payer: Self-pay | Admitting: Obstetrics and Gynecology

## 2017-10-26 ENCOUNTER — Encounter (HOSPITAL_COMMUNITY): Payer: Self-pay

## 2017-10-26 ENCOUNTER — Ambulatory Visit (HOSPITAL_COMMUNITY)
Admission: RE | Admit: 2017-10-26 | Discharge: 2017-10-26 | Disposition: A | Discharge status: Home / Self Care | Payer: Medicaid Other | Source: Ambulatory Visit | Attending: Certified Nurse Midwife | Admitting: Certified Nurse Midwife

## 2017-10-26 DIAGNOSIS — Z3A33 33 weeks gestation of pregnancy: Secondary | ICD-10-CM | POA: Insufficient documentation

## 2017-10-26 DIAGNOSIS — Z362 Encounter for other antenatal screening follow-up: Secondary | ICD-10-CM | POA: Diagnosis present

## 2017-10-26 DIAGNOSIS — O0933 Supervision of pregnancy with insufficient antenatal care, third trimester: Secondary | ICD-10-CM | POA: Diagnosis present

## 2017-10-26 DIAGNOSIS — O09299 Supervision of pregnancy with other poor reproductive or obstetric history, unspecified trimester: Secondary | ICD-10-CM

## 2017-10-26 DIAGNOSIS — O09293 Supervision of pregnancy with other poor reproductive or obstetric history, third trimester: Secondary | ICD-10-CM | POA: Insufficient documentation

## 2017-10-26 DIAGNOSIS — O99210 Obesity complicating pregnancy, unspecified trimester: Secondary | ICD-10-CM

## 2017-11-01 ENCOUNTER — Ambulatory Visit (INDEPENDENT_AMBULATORY_CARE_PROVIDER_SITE_OTHER): Payer: Medicaid Other | Admitting: Certified Nurse Midwife

## 2017-11-01 VITALS — BP 119/65 | HR 74 | Wt 199.6 lb

## 2017-11-01 DIAGNOSIS — O09299 Supervision of pregnancy with other poor reproductive or obstetric history, unspecified trimester: Secondary | ICD-10-CM

## 2017-11-01 DIAGNOSIS — O9989 Other specified diseases and conditions complicating pregnancy, childbirth and the puerperium: Secondary | ICD-10-CM

## 2017-11-01 DIAGNOSIS — Z283 Underimmunization status: Secondary | ICD-10-CM

## 2017-11-01 DIAGNOSIS — O09899 Supervision of other high risk pregnancies, unspecified trimester: Secondary | ICD-10-CM

## 2017-11-01 DIAGNOSIS — Z3480 Encounter for supervision of other normal pregnancy, unspecified trimester: Secondary | ICD-10-CM

## 2017-11-01 NOTE — Progress Notes (Signed)
   PRENATAL VISIT NOTE  Subjective:  Daisy Marquez is a 27 y.o. G4P3003 at 81w5dbeing seen today for ongoing prenatal care.  She is currently monitored for the following issues for this low-risk pregnancy and has Late prenatal care affecting pregnancy, antepartum; Hx of preeclampsia, prior pregnancy, currently pregnant; Supervision of other normal pregnancy, antepartum; Rubella non-immune status, antepartum; Obesity (BMI 30.0-34.9); and Obesity affecting pregnancy, antepartum on their problem list.  Patient reports no complaints.  Contractions: Irritability. Vag. Bleeding: None.  Movement: Present. Denies leaking of fluid.   The following portions of the patient's history were reviewed and updated as appropriate: allergies, current medications, past family history, past medical history, past social history, past surgical history and problem list. Problem list updated.  Objective:   Vitals:   11/01/17 1450  BP: 119/65  Pulse: 74  Weight: 199 lb 9.6 oz (90.5 kg)    Fetal Status: Fetal Heart Rate (bpm): 154; doppler Fundal Height: 32 cm Movement: Present     General:  Alert, oriented and cooperative. Patient is in no acute distress.  Skin: Skin is warm and dry. No rash noted.   Cardiovascular: Normal heart rate noted  Respiratory: Normal respiratory effort, no problems with respiration noted  Abdomen: Soft, gravid, appropriate for gestational age.  Pain/Pressure: Absent     Pelvic: Cervical exam deferred        Extremities: Normal range of motion.  Edema: None  Mental Status: Normal mood and affect. Normal behavior. Normal judgment and thought content.   Assessment and Plan:  Pregnancy: G4P3003 at 31w5d1. Supervision of other normal pregnancy, antepartum     Doing well  2. Rubella non-immune status, antepartum     MMR postpartum  3. Hx of preeclampsia, prior pregnancy, currently pregnant     Normotensive, taking baby ASA.  Preterm labor symptoms and general obstetric  precautions including but not limited to vaginal bleeding, contractions, leaking of fluid and fetal movement were reviewed in detail with the patient. Please refer to After Visit Summary for other counseling recommendations.  Return in about 1 week (around 11/08/2017) for ROB, GBS.  No future appointments.  RaMorene CrockerCNM

## 2017-11-08 ENCOUNTER — Ambulatory Visit (INDEPENDENT_AMBULATORY_CARE_PROVIDER_SITE_OTHER): Payer: Medicaid Other | Admitting: Certified Nurse Midwife

## 2017-11-08 ENCOUNTER — Other Ambulatory Visit (HOSPITAL_COMMUNITY)
Admission: RE | Admit: 2017-11-08 | Discharge: 2017-11-08 | Disposition: A | Discharge status: Home / Self Care | Payer: Medicaid Other | Source: Ambulatory Visit | Attending: Certified Nurse Midwife | Admitting: Certified Nurse Midwife

## 2017-11-08 ENCOUNTER — Encounter: Payer: Self-pay | Admitting: Certified Nurse Midwife

## 2017-11-08 VITALS — BP 121/73 | HR 87 | Wt 195.2 lb

## 2017-11-08 DIAGNOSIS — O9989 Other specified diseases and conditions complicating pregnancy, childbirth and the puerperium: Secondary | ICD-10-CM

## 2017-11-08 DIAGNOSIS — Z3483 Encounter for supervision of other normal pregnancy, third trimester: Secondary | ICD-10-CM

## 2017-11-08 DIAGNOSIS — O0930 Supervision of pregnancy with insufficient antenatal care, unspecified trimester: Secondary | ICD-10-CM

## 2017-11-08 DIAGNOSIS — O09299 Supervision of pregnancy with other poor reproductive or obstetric history, unspecified trimester: Secondary | ICD-10-CM

## 2017-11-08 DIAGNOSIS — Z3480 Encounter for supervision of other normal pregnancy, unspecified trimester: Secondary | ICD-10-CM

## 2017-11-08 DIAGNOSIS — O09293 Supervision of pregnancy with other poor reproductive or obstetric history, third trimester: Secondary | ICD-10-CM | POA: Insufficient documentation

## 2017-11-08 DIAGNOSIS — Z2839 Other underimmunization status: Secondary | ICD-10-CM

## 2017-11-08 DIAGNOSIS — O0933 Supervision of pregnancy with insufficient antenatal care, third trimester: Secondary | ICD-10-CM

## 2017-11-08 DIAGNOSIS — Z283 Underimmunization status: Secondary | ICD-10-CM

## 2017-11-08 DIAGNOSIS — Z23 Encounter for immunization: Secondary | ICD-10-CM | POA: Diagnosis not present

## 2017-11-08 DIAGNOSIS — R319 Hematuria, unspecified: Secondary | ICD-10-CM | POA: Diagnosis not present

## 2017-11-08 DIAGNOSIS — Z3A35 35 weeks gestation of pregnancy: Secondary | ICD-10-CM | POA: Diagnosis not present

## 2017-11-08 LAB — POCT URINALYSIS DIPSTICK
Bilirubin, UA: NEGATIVE
GLUCOSE UA: NEGATIVE
Nitrite, UA: NEGATIVE
PH UA: 7 (ref 5.0–8.0)
Protein, UA: NEGATIVE
Spec Grav, UA: 1.015 (ref 1.010–1.025)
UROBILINOGEN UA: 0.2 U/dL

## 2017-11-08 NOTE — Progress Notes (Signed)
Pt c/o hematuria. She thinks it's related to daily beet juice.

## 2017-11-08 NOTE — Progress Notes (Signed)
   PRENATAL VISIT NOTE  Subjective:  Daisy Marquez is a 27 y.o. G4P3003 at 88w5dbeing seen today for ongoing prenatal care.  She is currently monitored for the following issues for this low-risk pregnancy and has Late prenatal care affecting pregnancy, antepartum; Hx of preeclampsia, prior pregnancy, currently pregnant; Supervision of other normal pregnancy, antepartum; Rubella non-immune status, antepartum; Obesity (BMI 30.0-34.9); and Obesity affecting pregnancy, antepartum on their problem list.  Patient reports no bleeding, no leaking, occasional contractions and urinary symptoms of slight pain with urination and change in color of urine.  Contractions: Irregular. Vag. Bleeding: None.  Movement: Present. Denies leaking of fluid.   The following portions of the patient's history were reviewed and updated as appropriate: allergies, current medications, past family history, past medical history, past social history, past surgical history and problem list. Problem list updated.  Objective:   Vitals:   11/08/17 1520  BP: 121/73  Pulse: 87  Weight: 195 lb 3.2 oz (88.5 kg)    Fetal Status: Fetal Heart Rate (bpm): 148; doppler Fundal Height: 32 cm Movement: Present  Presentation: Vertex  General:  Alert, oriented and cooperative. Patient is in no acute distress.  Skin: Skin is warm and dry. No rash noted.   Cardiovascular: Normal heart rate noted  Respiratory: Normal respiratory effort, no problems with respiration noted  Abdomen: Soft, gravid, appropriate for gestational age.  Pain/Pressure: Absent     Pelvic: Cervical exam performed Dilation: Closed Effacement (%): 0 Station: -2  Extremities: Normal range of motion.  Edema: None  Mental Status: Normal mood and affect. Normal behavior. Normal judgment and thought content.   Assessment and Plan:  Pregnancy: G4P3003 at 325w5d1. Supervision of other normal pregnancy, antepartum        - Strep Gp B NAA - Cervicovaginal ancillary  only - Tdap vaccine greater than or equal to 7yo IM  2. Rubella non-immune status, antepartum     MMR postpartum  3. Late prenatal care affecting pregnancy, antepartum       4. Hx of preeclampsia, prior pregnancy, currently pregnant     Normotensive today  5. Hematuria, unspecified type      - POCT Urinalysis Dipstick - Culture, OB Urine  Preterm labor symptoms and general obstetric precautions including but not limited to vaginal bleeding, contractions, leaking of fluid and fetal movement were reviewed in detail with the patient. Please refer to After Visit Summary for other counseling recommendations.  No follow-ups on file.  No future appointments.  RaMorene CrockerCNM

## 2017-11-09 LAB — CERVICOVAGINAL ANCILLARY ONLY
Chlamydia: NEGATIVE
Neisseria Gonorrhea: NEGATIVE

## 2017-11-10 LAB — CULTURE, OB URINE

## 2017-11-10 LAB — STREP GP B NAA: Strep Gp B NAA: NEGATIVE

## 2017-11-10 LAB — URINE CULTURE, OB REFLEX

## 2017-11-14 ENCOUNTER — Other Ambulatory Visit: Payer: Self-pay | Admitting: Certified Nurse Midwife

## 2017-11-14 DIAGNOSIS — Z3480 Encounter for supervision of other normal pregnancy, unspecified trimester: Secondary | ICD-10-CM

## 2017-11-16 ENCOUNTER — Encounter: Payer: Self-pay | Admitting: Certified Nurse Midwife

## 2017-11-16 ENCOUNTER — Ambulatory Visit (INDEPENDENT_AMBULATORY_CARE_PROVIDER_SITE_OTHER): Payer: Medicaid Other | Admitting: Certified Nurse Midwife

## 2017-11-16 VITALS — BP 119/69 | HR 80 | Wt 201.0 lb

## 2017-11-16 DIAGNOSIS — Z3483 Encounter for supervision of other normal pregnancy, third trimester: Secondary | ICD-10-CM

## 2017-11-16 DIAGNOSIS — O99210 Obesity complicating pregnancy, unspecified trimester: Secondary | ICD-10-CM

## 2017-11-16 DIAGNOSIS — O09293 Supervision of pregnancy with other poor reproductive or obstetric history, third trimester: Secondary | ICD-10-CM

## 2017-11-16 DIAGNOSIS — O9989 Other specified diseases and conditions complicating pregnancy, childbirth and the puerperium: Secondary | ICD-10-CM

## 2017-11-16 DIAGNOSIS — O99213 Obesity complicating pregnancy, third trimester: Secondary | ICD-10-CM

## 2017-11-16 DIAGNOSIS — Z283 Underimmunization status: Secondary | ICD-10-CM

## 2017-11-16 DIAGNOSIS — O09899 Supervision of other high risk pregnancies, unspecified trimester: Secondary | ICD-10-CM

## 2017-11-16 DIAGNOSIS — Z3480 Encounter for supervision of other normal pregnancy, unspecified trimester: Secondary | ICD-10-CM

## 2017-11-16 DIAGNOSIS — O09299 Supervision of pregnancy with other poor reproductive or obstetric history, unspecified trimester: Secondary | ICD-10-CM

## 2017-11-16 NOTE — Progress Notes (Signed)
   PRENATAL VISIT NOTE  Subjective:  Daisy Marquez is a 27 y.o. G4P3003 at 69w6dbeing seen today for ongoing prenatal care.  She is currently monitored for the following issues for this low-risk pregnancy and has Late prenatal care affecting pregnancy, antepartum; Hx of preeclampsia, prior pregnancy, currently pregnant; Supervision of other normal pregnancy, antepartum; Rubella non-immune status, antepartum; Obesity (BMI 30.0-34.9); and Obesity affecting pregnancy, antepartum on their problem list.  Patient reports no bleeding, no contractions, no cramping, no leaking and URI symptoms, OTC medications discussed.  Contractions: Not present. Vag. Bleeding: None.  Movement: Present. Denies leaking of fluid.   The following portions of the patient's history were reviewed and updated as appropriate: allergies, current medications, past family history, past medical history, past social history, past surgical history and problem list. Problem list updated.  Objective:   Vitals:   11/16/17 1530  BP: 119/69  Pulse: 80  Weight: 201 lb (91.2 kg)    Fetal Status: Fetal Heart Rate (bpm): 152; doppler Fundal Height: 34 cm Movement: Present     General:  Alert, oriented and cooperative. Patient is in no acute distress.  Skin: Skin is warm and dry. No rash noted.   Cardiovascular: Normal heart rate noted  Respiratory: Normal respiratory effort, no problems with respiration noted  Abdomen: Soft, gravid, appropriate for gestational age.  Pain/Pressure: Present     Pelvic: Cervical exam deferred        Extremities: Normal range of motion.     Mental Status: Normal mood and affect. Normal behavior. Normal judgment and thought content.   Assessment and Plan:  Pregnancy: G4P3003 at 387w6d1. Supervision of other normal pregnancy, antepartum     Acute URI: OTC medications.   2. Rubella non-immune status, antepartum     MMR postpartum  3. Obesity affecting pregnancy, antepartum    Has lost 1 lb this  pregnancy, growth [place] 10/26/17: EFW: 51%  4. Hx of preeclampsia, prior pregnancy, currently pregnant    Normotensive today, taking baby ASA  Preterm labor symptoms and general obstetric precautions including but not limited to vaginal bleeding, contractions, leaking of fluid and fetal movement were reviewed in detail with the patient. Please refer to After Visit Summary for other counseling recommendations.  Return in about 1 week (around 11/23/2017) for ROB.  No future appointments.  RaMorene CrockerCNM

## 2017-11-24 ENCOUNTER — Ambulatory Visit (INDEPENDENT_AMBULATORY_CARE_PROVIDER_SITE_OTHER): Payer: Medicaid Other | Admitting: Certified Nurse Midwife

## 2017-11-24 VITALS — BP 125/68 | HR 86 | Wt 193.0 lb

## 2017-11-24 DIAGNOSIS — Z3480 Encounter for supervision of other normal pregnancy, unspecified trimester: Secondary | ICD-10-CM

## 2017-11-24 DIAGNOSIS — Z283 Underimmunization status: Secondary | ICD-10-CM

## 2017-11-24 DIAGNOSIS — O9989 Other specified diseases and conditions complicating pregnancy, childbirth and the puerperium: Secondary | ICD-10-CM

## 2017-11-24 DIAGNOSIS — O09299 Supervision of pregnancy with other poor reproductive or obstetric history, unspecified trimester: Secondary | ICD-10-CM

## 2017-11-24 DIAGNOSIS — O99210 Obesity complicating pregnancy, unspecified trimester: Secondary | ICD-10-CM

## 2017-11-24 DIAGNOSIS — Z2839 Other underimmunization status: Secondary | ICD-10-CM

## 2017-11-24 NOTE — Progress Notes (Signed)
   PRENATAL VISIT NOTE  Subjective:  Daisy Marquez is a 27 y.o. G4P3003 at 37w0dbeing seen today for ongoing prenatal care.  She is currently monitored for the following issues for this low-risk pregnancy and has Late prenatal care affecting pregnancy, antepartum; Hx of preeclampsia, prior pregnancy, currently pregnant; Supervision of other normal pregnancy, antepartum; Rubella non-immune status, antepartum; Obesity (BMI 30.0-34.9); and Obesity affecting pregnancy, antepartum on their problem list.  Patient reports no complaints.  Contractions: Irritability. Vag. Bleeding: None.  Movement: Present. Denies leaking of fluid.   The following portions of the patient's history were reviewed and updated as appropriate: allergies, current medications, past family history, past medical history, past social history, past surgical history and problem list. Problem list updated.  Objective:   Vitals:   11/24/17 0901  BP: 125/68  Pulse: 86  Weight: 193 lb (87.5 kg)    Fetal Status: Fetal Heart Rate (bpm): 142; doppler Fundal Height: 35 cm Movement: Present     General:  Alert, oriented and cooperative. Patient is in no acute distress.  Skin: Skin is warm and dry. No rash noted.   Cardiovascular: Normal heart rate noted  Respiratory: Normal respiratory effort, no problems with respiration noted  Abdomen: Soft, gravid, appropriate for gestational age.  Pain/Pressure: Present     Pelvic: Cervical exam deferred        Extremities: Normal range of motion.  Edema: None  Mental Status: Normal mood and affect. Normal behavior. Normal judgment and thought content.   Assessment and Plan:  Pregnancy: G4P3003 at 327w0d1. Supervision of other normal pregnancy, antepartum     Doing well.   2. Rubella non-immune status, antepartum     MMR postpartum  3. Obesity affecting pregnancy, antepartum     Has lost 9lbs this pregancy  4. Hx of preeclampsia, prior pregnancy, currently pregnant  Normotensive, has taken baby ASA in the past.   Term labor symptoms and general obstetric precautions including but not limited to vaginal bleeding, contractions, leaking of fluid and fetal movement were reviewed in detail with the patient. Please refer to After Visit Summary for other counseling recommendations.  Return in about 1 week (around 12/01/2017) for [place] No future appointments.  RaMorene CrockerCNM

## 2017-12-01 ENCOUNTER — Ambulatory Visit (INDEPENDENT_AMBULATORY_CARE_PROVIDER_SITE_OTHER): Payer: Medicaid Other | Admitting: Certified Nurse Midwife

## 2017-12-01 VITALS — BP 119/73 | HR 67 | Wt 194.0 lb

## 2017-12-01 DIAGNOSIS — O99213 Obesity complicating pregnancy, third trimester: Secondary | ICD-10-CM

## 2017-12-01 DIAGNOSIS — Z3480 Encounter for supervision of other normal pregnancy, unspecified trimester: Secondary | ICD-10-CM

## 2017-12-01 DIAGNOSIS — Z3483 Encounter for supervision of other normal pregnancy, third trimester: Secondary | ICD-10-CM

## 2017-12-01 DIAGNOSIS — O9989 Other specified diseases and conditions complicating pregnancy, childbirth and the puerperium: Secondary | ICD-10-CM

## 2017-12-01 DIAGNOSIS — Z2839 Other underimmunization status: Secondary | ICD-10-CM

## 2017-12-01 DIAGNOSIS — Z283 Underimmunization status: Secondary | ICD-10-CM

## 2017-12-01 DIAGNOSIS — O99210 Obesity complicating pregnancy, unspecified trimester: Secondary | ICD-10-CM

## 2017-12-01 NOTE — Progress Notes (Signed)
   PRENATAL VISIT NOTE  Subjective:  Daisy Marquez is a 27 y.o. G4P3003 at 88w0dbeing seen today for ongoing prenatal care.  She is currently monitored for the following issues for this low-risk pregnancy and has Late prenatal care affecting pregnancy, antepartum; Hx of preeclampsia, prior pregnancy, currently pregnant; Supervision of other normal pregnancy, antepartum; Rubella non-immune status, antepartum; Obesity (BMI 30.0-34.9); and Obesity affecting pregnancy, antepartum on their problem list.  Patient reports no complaints.  Contractions: Irritability. Vag. Bleeding: None.  Movement: Present. Denies leaking of fluid.   The following portions of the patient's history were reviewed and updated as appropriate: allergies, current medications, past family history, past medical history, past social history, past surgical history and problem list. Problem list updated.  Objective:   Vitals:   12/01/17 0912  BP: 119/73  Pulse: 67  Weight: 194 lb (88 kg)    Fetal Status: Fetal Heart Rate (bpm): 145; doppler Fundal Height: 35 cm Movement: Present  Presentation: Vertex  General:  Alert, oriented and cooperative. Patient is in no acute distress.  Skin: Skin is warm and dry. No rash noted.   Cardiovascular: Normal heart rate noted  Respiratory: Normal respiratory effort, no problems with respiration noted  Abdomen: Soft, gravid, appropriate for gestational age.  Pain/Pressure: Present     Pelvic: Cervical exam performed Dilation: Closed Effacement (%): 0 Station: -2  Extremities: Normal range of motion.  Edema: None  Mental Status: Normal mood and affect. Normal behavior. Normal judgment and thought content.   Assessment and Plan:  Pregnancy: G4P3003 at 345w0d1. Supervision of other normal pregnancy, antepartum      Doing well.    2. Rubella non-immune status, antepartum     MMR postpartum  3. Obesity affecting pregnancy, antepartum     Has lost 8lbs this pregnancy  Term labor  symptoms and general obstetric precautions including but not limited to vaginal bleeding, contractions, leaking of fluid and fetal movement were reviewed in detail with the patient. Please refer to After Visit Summary for other counseling recommendations.  Return in about 1 week (around 12/08/2017) for ROB, NST.  No future appointments.  RaMorene CrockerCNM

## 2017-12-01 NOTE — Progress Notes (Signed)
Pt request cervix check today.

## 2017-12-07 ENCOUNTER — Telehealth (HOSPITAL_COMMUNITY): Payer: Self-pay | Admitting: *Deleted

## 2017-12-07 ENCOUNTER — Ambulatory Visit (INDEPENDENT_AMBULATORY_CARE_PROVIDER_SITE_OTHER): Payer: Medicaid Other | Admitting: Certified Nurse Midwife

## 2017-12-07 ENCOUNTER — Encounter (HOSPITAL_COMMUNITY): Payer: Self-pay | Admitting: *Deleted

## 2017-12-07 VITALS — BP 122/77 | HR 73 | Wt 197.0 lb

## 2017-12-07 DIAGNOSIS — Z283 Underimmunization status: Secondary | ICD-10-CM

## 2017-12-07 DIAGNOSIS — Z3483 Encounter for supervision of other normal pregnancy, third trimester: Secondary | ICD-10-CM

## 2017-12-07 DIAGNOSIS — O09293 Supervision of pregnancy with other poor reproductive or obstetric history, third trimester: Secondary | ICD-10-CM

## 2017-12-07 DIAGNOSIS — O09899 Supervision of other high risk pregnancies, unspecified trimester: Secondary | ICD-10-CM

## 2017-12-07 DIAGNOSIS — O9989 Other specified diseases and conditions complicating pregnancy, childbirth and the puerperium: Secondary | ICD-10-CM

## 2017-12-07 DIAGNOSIS — O99213 Obesity complicating pregnancy, third trimester: Secondary | ICD-10-CM

## 2017-12-07 DIAGNOSIS — Z3480 Encounter for supervision of other normal pregnancy, unspecified trimester: Secondary | ICD-10-CM

## 2017-12-07 DIAGNOSIS — O09299 Supervision of pregnancy with other poor reproductive or obstetric history, unspecified trimester: Secondary | ICD-10-CM

## 2017-12-07 DIAGNOSIS — E669 Obesity, unspecified: Secondary | ICD-10-CM

## 2017-12-07 NOTE — Telephone Encounter (Signed)
Preadmission screen  

## 2017-12-07 NOTE — Progress Notes (Signed)
Patient reports good fetal movement with irregular contractions.  

## 2017-12-07 NOTE — Progress Notes (Signed)
   PRENATAL VISIT NOTE  Subjective:  Daisy Marquez is a 27 y.o. G4P3003 at 66w6dbeing seen today for ongoing prenatal care.  She is currently monitored for the following issues for this low-risk pregnancy and has Late prenatal care affecting pregnancy, antepartum; Hx of preeclampsia, prior pregnancy, currently pregnant; Supervision of other normal pregnancy, antepartum; Rubella non-immune status, antepartum; Obesity (BMI 30.0-34.9); and Obesity affecting pregnancy, antepartum on their problem list.  Patient reports no complaints.  Contractions: Irregular. Vag. Bleeding: None.  Movement: Present. Denies leaking of fluid.   The following portions of the patient's history were reviewed and updated as appropriate: allergies, current medications, past family history, past medical history, past social history, past surgical history and problem list. Problem list updated.  Objective:   Vitals:   12/07/17 0852  BP: 122/77  Pulse: 73  Weight: 197 lb (89.4 kg)    Fetal Status:     Movement: Present     General:  Alert, oriented and cooperative. Patient is in no acute distress.  Skin: Skin is warm and dry. No rash noted.   Cardiovascular: Normal heart rate noted  Respiratory: Normal respiratory effort, no problems with respiration noted  Abdomen: Soft, gravid, appropriate for gestational age.  Pain/Pressure: Present     Pelvic: Cervical exam deferred        Extremities: Normal range of motion.  Edema: None  Mental Status: Normal mood and affect. Normal behavior. Normal judgment and thought content.  NST: + accels, no decels, moderate variability, Cat. 1 tracing. 1 contractions on toco.   Assessment and Plan:  Pregnancy: G4P3003 at 374w6d1. Supervision of other normal pregnancy, antepartum      Doing well. Reactive NST.  IOL scheduled for 12/15/17.  - Fetal nonstress test; Future  2. Rubella non-immune status, antepartum     MMR postpartum  3. Obesity (BMI 30.0-34.9)     Has lost  roughly 5 lbs this pregnancy  4. Hx of preeclampsia, prior pregnancy, currently pregnant    Normotensive today, taking baby ASA  Term labor symptoms and general obstetric precautions including but not limited to vaginal bleeding, contractions, leaking of fluid and fetal movement were reviewed in detail with the patient. Please refer to After Visit Summary for other counseling recommendations.  Return in about 1 week (around 12/14/2017) for ROB, NST/Foley Bulb.  Future Appointments  Date Time Provider [place]7/19/2019 12:00 AM [facility] ROOM [facility] None    RaMorene CrockerCNM

## 2017-12-10 ENCOUNTER — Inpatient Hospital Stay (HOSPITAL_COMMUNITY)
Admission: AD | Admit: 2017-12-10 | Discharge: 2017-12-10 | Disposition: A | Discharge status: Home / Self Care | Payer: Medicaid Other | Source: Ambulatory Visit | Attending: Obstetrics & Gynecology | Admitting: Obstetrics & Gynecology

## 2017-12-10 DIAGNOSIS — O4703 False labor before 37 completed weeks of gestation, third trimester: Secondary | ICD-10-CM | POA: Insufficient documentation

## 2017-12-10 DIAGNOSIS — O479 False labor, unspecified: Secondary | ICD-10-CM

## 2017-12-10 DIAGNOSIS — O99210 Obesity complicating pregnancy, unspecified trimester: Secondary | ICD-10-CM

## 2017-12-10 NOTE — MAU Note (Signed)
Ctx since midnight more uncomfortable reports some vaginal discharge not sure if her water broke good fetal movement felt.

## 2017-12-10 NOTE — MAU Note (Signed)
I have communicated with L.Leftwich-Kirby,CNM and reviewed vital signs:  Vitals:   12/10/17 0237  BP: 129/68  Pulse: 74  Resp: 18  Temp: 97.7 F (36.5 C)    Vaginal exam:  Dilation: Fingertip Effacement (%): 20 Station: -2 Presentation: Vertex Exam by:: K.Myrtis Maille,RN,   Also reviewed contraction pattern and that non-stress test is reactive.  It has been documented that patient is contracting every 5-7 minutes with no cervical change from office visit not indicating active labor.  Patient denies any other complaints.  Based on this report provider has given order for discharge.  A discharge order and diagnosis entered by a provider.   Labor discharge instructions reviewed with patient.

## 2017-12-12 ENCOUNTER — Inpatient Hospital Stay (HOSPITAL_COMMUNITY): Payer: Medicaid Other | Admitting: Anesthesiology

## 2017-12-12 ENCOUNTER — Inpatient Hospital Stay (HOSPITAL_COMMUNITY)
Admission: AD | Admit: 2017-12-12 | Discharge: 2017-12-13 | DRG: 798 | Disposition: A | Discharge status: Home / Self Care | Payer: Medicaid Other | Source: Ambulatory Visit | Attending: Family Medicine | Admitting: Family Medicine

## 2017-12-12 ENCOUNTER — Inpatient Hospital Stay (HOSPITAL_COMMUNITY)
Admission: AD | Admit: 2017-12-12 | Discharge: 2017-12-12 | Disposition: A | Discharge status: Home / Self Care | Payer: Medicaid Other | Source: Ambulatory Visit | Attending: Obstetrics and Gynecology | Admitting: Obstetrics and Gynecology

## 2017-12-12 ENCOUNTER — Encounter (HOSPITAL_COMMUNITY)
Admission: AD | Disposition: A | Discharge status: Home / Self Care | Payer: Self-pay | Source: Ambulatory Visit | Attending: Family Medicine

## 2017-12-12 ENCOUNTER — Encounter (HOSPITAL_COMMUNITY): Payer: Self-pay | Admitting: *Deleted

## 2017-12-12 DIAGNOSIS — Z3A40 40 weeks gestation of pregnancy: Secondary | ICD-10-CM

## 2017-12-12 DIAGNOSIS — Z3490 Encounter for supervision of normal pregnancy, unspecified, unspecified trimester: Secondary | ICD-10-CM

## 2017-12-12 DIAGNOSIS — E669 Obesity, unspecified: Secondary | ICD-10-CM | POA: Diagnosis present

## 2017-12-12 DIAGNOSIS — O480 Post-term pregnancy: Secondary | ICD-10-CM | POA: Diagnosis present

## 2017-12-12 DIAGNOSIS — Z833 Family history of diabetes mellitus: Secondary | ICD-10-CM | POA: Diagnosis not present

## 2017-12-12 DIAGNOSIS — Z302 Encounter for sterilization: Secondary | ICD-10-CM

## 2017-12-12 DIAGNOSIS — O99210 Obesity complicating pregnancy, unspecified trimester: Secondary | ICD-10-CM

## 2017-12-12 DIAGNOSIS — O99214 Obesity complicating childbirth: Secondary | ICD-10-CM | POA: Diagnosis present

## 2017-12-12 DIAGNOSIS — O479 False labor, unspecified: Secondary | ICD-10-CM

## 2017-12-12 HISTORY — PX: TUBAL LIGATION: SHX77

## 2017-12-12 LAB — CBC
HEMATOCRIT: 39.6 % (ref 36.0–46.0)
Hemoglobin: 12.8 g/dL (ref 12.0–15.0)
MCH: 28.3 pg (ref 26.0–34.0)
MCHC: 32.3 g/dL (ref 30.0–36.0)
MCV: 87.4 fL (ref 78.0–100.0)
Platelets: 240 10*3/uL (ref 150–400)
RBC: 4.53 MIL/uL (ref 3.87–5.11)
RDW: 14.3 % (ref 11.5–15.5)
WBC: 12.1 10*3/uL — ABNORMAL HIGH (ref 4.0–10.5)

## 2017-12-12 LAB — TYPE AND SCREEN
ABO/RH(D): O POS
Antibody Screen: NEGATIVE

## 2017-12-12 SURGERY — LIGATION, FALLOPIAN TUBE, POSTPARTUM
Anesthesia: Epidural | Site: Abdomen | Laterality: Bilateral | Wound class: Clean Contaminated

## 2017-12-12 MED ORDER — NALOXONE HCL 4 MG/10ML IJ SOLN: 1.0000 ug/kg/h | INTRAVENOUS | Status: DC | PRN

## 2017-12-12 MED ORDER — EPHEDRINE 5 MG/ML INJ: 10.0000 mg | INTRAVENOUS | Status: DC | PRN

## 2017-12-12 MED ORDER — TETANUS-DIPHTH-ACELL PERTUSSIS 5-2.5-18.5 LF-MCG/0.5 IM SUSP: 0.5000 mL | Freq: Once | INTRAMUSCULAR | Status: DC

## 2017-12-12 MED ORDER — DIPHENHYDRAMINE HCL 50 MG/ML IJ SOLN: 12.5000 mg | INTRAMUSCULAR | Status: DC | PRN

## 2017-12-12 MED ORDER — ACETAMINOPHEN 325 MG PO TABS: 650.0000 mg | ORAL_TABLET | ORAL | Status: DC | PRN

## 2017-12-12 MED ORDER — DIPHENHYDRAMINE HCL 25 MG PO CAPS
25.0000 mg | ORAL_CAPSULE | ORAL | Status: DC | PRN
  Filled 2017-12-12: qty 1

## 2017-12-12 MED ORDER — DIPHENHYDRAMINE HCL 25 MG PO CAPS
25.0000 mg | ORAL_CAPSULE | Freq: Four times a day (QID) | ORAL | Status: DC | PRN
Start: 2017-12-12 — End: 2017-12-13

## 2017-12-12 MED ORDER — FENTANYL CITRATE (PF) 100 MCG/2ML IJ SOLN: 25.0000 ug | INTRAMUSCULAR | Status: DC | PRN

## 2017-12-12 MED ORDER — ONDANSETRON HCL 4 MG/2ML IJ SOLN
INTRAMUSCULAR | Status: DC | PRN
  Administered 2017-12-12: 4 mg via INTRAVENOUS

## 2017-12-12 MED ORDER — SODIUM CHLORIDE 0.9 % IR SOLN
Status: DC | PRN
  Administered 2017-12-12: 1

## 2017-12-12 MED ORDER — LACTATED RINGERS IV SOLN: 500.0000 mL | INTRAVENOUS | Status: DC | PRN

## 2017-12-12 MED ORDER — SIMETHICONE 80 MG PO CHEW: 80.0000 mg | CHEWABLE_TABLET | ORAL | Status: DC | PRN

## 2017-12-12 MED ORDER — LACTATED RINGERS IV SOLN
INTRAVENOUS | Status: DC | PRN
  Administered 2017-12-12: 17:00:00 via INTRAVENOUS

## 2017-12-12 MED ORDER — LACTATED RINGERS IV SOLN
INTRAVENOUS | Status: DC
  Administered 2017-12-12: 13:00:00 via INTRAVENOUS

## 2017-12-12 MED ORDER — PHENYLEPHRINE 40 MCG/ML (10ML) SYRINGE FOR IV PUSH (FOR BLOOD PRESSURE SUPPORT)
80.0000 ug | PREFILLED_SYRINGE | INTRAVENOUS | Status: DC | PRN
  Filled 2017-12-12: qty 10

## 2017-12-12 MED ORDER — PHENYLEPHRINE 40 MCG/ML (10ML) SYRINGE FOR IV PUSH (FOR BLOOD PRESSURE SUPPORT): 80.0000 ug | PREFILLED_SYRINGE | INTRAVENOUS | Status: DC | PRN

## 2017-12-12 MED ORDER — KETOROLAC TROMETHAMINE 30 MG/ML IJ SOLN
INTRAMUSCULAR | Status: DC | PRN
  Administered 2017-12-12: 30 mg via INTRAVENOUS

## 2017-12-12 MED ORDER — WITCH HAZEL-GLYCERIN EX PADS: 1.0000 "application " | MEDICATED_PAD | CUTANEOUS | Status: DC | PRN

## 2017-12-12 MED ORDER — OXYTOCIN BOLUS FROM INFUSION
500.0000 mL | Freq: Once | INTRAVENOUS | Status: AC
  Administered 2017-12-12: 500 mL via INTRAVENOUS

## 2017-12-12 MED ORDER — BUPIVACAINE HCL (PF) 0.25 % IJ SOLN
INTRAMUSCULAR | Status: DC | PRN
  Administered 2017-12-12: 20 mL

## 2017-12-12 MED ORDER — FLEET ENEMA 7-19 GM/118ML RE ENEM: 1.0000 | ENEMA | RECTAL | Status: DC | PRN

## 2017-12-12 MED ORDER — COCONUT OIL OIL: 1.0000 "application " | TOPICAL_OIL | Status: DC | PRN

## 2017-12-12 MED ORDER — LACTATED RINGERS IV SOLN: 500.0000 mL | Freq: Once | INTRAVENOUS | Status: DC

## 2017-12-12 MED ORDER — BUPIVACAINE HCL (PF) 0.25 % IJ SOLN
INTRAMUSCULAR | Status: AC
  Filled 2017-12-12: qty 30

## 2017-12-12 MED ORDER — SOD CITRATE-CITRIC ACID 500-334 MG/5ML PO SOLN
30.0000 mL | ORAL | Status: DC | PRN
  Administered 2017-12-12: 30 mL via ORAL
  Filled 2017-12-12: qty 15

## 2017-12-12 MED ORDER — ONDANSETRON HCL 4 MG/2ML IJ SOLN
INTRAMUSCULAR | Status: AC
  Filled 2017-12-12: qty 2

## 2017-12-12 MED ORDER — PRENATAL MULTIVITAMIN CH
1.0000 | ORAL_TABLET | Freq: Every day | ORAL | Status: DC
  Administered 2017-12-13: 1 via ORAL
  Filled 2017-12-12: qty 1

## 2017-12-12 MED ORDER — OXYCODONE HCL 5 MG/5ML PO SOLN
5.0000 mg | Freq: Once | ORAL | Status: DC | PRN
Start: 2017-12-12 — End: 2017-12-12

## 2017-12-12 MED ORDER — LIDOCAINE-EPINEPHRINE (PF) 2 %-1:200000 IJ SOLN
INTRAMUSCULAR | Status: DC | PRN
  Administered 2017-12-12: 2 mL via EPIDURAL
  Administered 2017-12-12: 3 mL via EPIDURAL
  Administered 2017-12-12: 5 mL via EPIDURAL
  Administered 2017-12-12: 3 mL via EPIDURAL

## 2017-12-12 MED ORDER — OXYCODONE HCL 5 MG PO TABS
5.0000 mg | ORAL_TABLET | ORAL | Status: DC | PRN
  Administered 2017-12-13: 5 mg via ORAL
  Filled 2017-12-12: qty 1

## 2017-12-12 MED ORDER — SODIUM CHLORIDE 0.9% FLUSH: 3.0000 mL | INTRAVENOUS | Status: DC | PRN

## 2017-12-12 MED ORDER — LIDOCAINE HCL (PF) 1 % IJ SOLN
INTRAMUSCULAR | Status: DC | PRN
  Administered 2017-12-12 (×2): 5 mL via EPIDURAL

## 2017-12-12 MED ORDER — OXYCODONE HCL 5 MG PO TABS: 5.0000 mg | ORAL_TABLET | Freq: Once | ORAL | Status: DC | PRN

## 2017-12-12 MED ORDER — SODIUM BICARBONATE 8.4 % IV SOLN
INTRAVENOUS | Status: AC
  Filled 2017-12-12: qty 50

## 2017-12-12 MED ORDER — BUTORPHANOL TARTRATE 1 MG/ML IJ SOLN
1.0000 mg | Freq: Once | INTRAMUSCULAR | Status: AC
  Administered 2017-12-12: 1 mg via INTRAMUSCULAR
  Filled 2017-12-12: qty 1

## 2017-12-12 MED ORDER — ONDANSETRON HCL 4 MG PO TABS: 4.0000 mg | ORAL_TABLET | ORAL | Status: DC | PRN

## 2017-12-12 MED ORDER — ONDANSETRON HCL 4 MG/2ML IJ SOLN: 4.0000 mg | Freq: Four times a day (QID) | INTRAMUSCULAR | Status: DC | PRN

## 2017-12-12 MED ORDER — KETOROLAC TROMETHAMINE 30 MG/ML IJ SOLN: 30.0000 mg | Freq: Four times a day (QID) | INTRAMUSCULAR | Status: DC | PRN

## 2017-12-12 MED ORDER — OXYTOCIN 40 UNITS IN LACTATED RINGERS INFUSION - SIMPLE MED
2.5000 [IU]/h | INTRAVENOUS | Status: DC
  Filled 2017-12-12: qty 1000

## 2017-12-12 MED ORDER — ONDANSETRON HCL 4 MG/2ML IJ SOLN: 4.0000 mg | Freq: Three times a day (TID) | INTRAMUSCULAR | Status: DC | PRN

## 2017-12-12 MED ORDER — LACTATED RINGERS IV SOLN
INTRAVENOUS | Status: DC | PRN
  Administered 2017-12-12: 40 [IU] via INTRAVENOUS

## 2017-12-12 MED ORDER — ZOLPIDEM TARTRATE 5 MG PO TABS: 5.0000 mg | ORAL_TABLET | Freq: Every evening | ORAL | Status: DC | PRN

## 2017-12-12 MED ORDER — OXYCODONE-ACETAMINOPHEN 5-325 MG PO TABS: 1.0000 | ORAL_TABLET | ORAL | Status: DC | PRN

## 2017-12-12 MED ORDER — NALBUPHINE HCL 10 MG/ML IJ SOLN: 5.0000 mg | INTRAMUSCULAR | Status: DC | PRN

## 2017-12-12 MED ORDER — SCOPOLAMINE 1 MG/3DAYS TD PT72: 1.0000 | MEDICATED_PATCH | Freq: Once | TRANSDERMAL | Status: DC

## 2017-12-12 MED ORDER — NALBUPHINE HCL 10 MG/ML IJ SOLN: 5.0000 mg | Freq: Once | INTRAMUSCULAR | Status: DC | PRN

## 2017-12-12 MED ORDER — ONDANSETRON HCL 4 MG/2ML IJ SOLN: 4.0000 mg | INTRAMUSCULAR | Status: DC | PRN

## 2017-12-12 MED ORDER — OXYCODONE-ACETAMINOPHEN 5-325 MG PO TABS: 2.0000 | ORAL_TABLET | ORAL | Status: DC | PRN

## 2017-12-12 MED ORDER — LIDOCAINE HCL (PF) 1 % IJ SOLN
30.0000 mL | INTRAMUSCULAR | Status: DC | PRN
  Filled 2017-12-12: qty 30

## 2017-12-12 MED ORDER — SENNOSIDES-DOCUSATE SODIUM 8.6-50 MG PO TABS
2.0000 | ORAL_TABLET | ORAL | Status: DC
  Administered 2017-12-13: 2 via ORAL
  Filled 2017-12-12: qty 2

## 2017-12-12 MED ORDER — IBUPROFEN 600 MG PO TABS
600.0000 mg | ORAL_TABLET | Freq: Four times a day (QID) | ORAL | Status: DC
  Administered 2017-12-13 (×3): 600 mg via ORAL
  Filled 2017-12-12 (×3): qty 1

## 2017-12-12 MED ORDER — FENTANYL CITRATE (PF) 100 MCG/2ML IJ SOLN
INTRAMUSCULAR | Status: DC | PRN
  Administered 2017-12-12 (×2): 50 ug via INTRAVENOUS

## 2017-12-12 MED ORDER — FENTANYL CITRATE (PF) 100 MCG/2ML IJ SOLN
INTRAMUSCULAR | Status: AC
  Filled 2017-12-12: qty 2

## 2017-12-12 MED ORDER — KETOROLAC TROMETHAMINE 30 MG/ML IJ SOLN
INTRAMUSCULAR | Status: AC
  Filled 2017-12-12: qty 1

## 2017-12-12 MED ORDER — DIBUCAINE 1 % RE OINT: 1.0000 "application " | TOPICAL_OINTMENT | RECTAL | Status: DC | PRN

## 2017-12-12 MED ORDER — FENTANYL 2.5 MCG/ML BUPIVACAINE 1/10 % EPIDURAL INFUSION (WH - ANES)
14.0000 mL/h | INTRAMUSCULAR | Status: DC | PRN
  Administered 2017-12-12: 14 mL/h via EPIDURAL
  Filled 2017-12-12: qty 100

## 2017-12-12 MED ORDER — MEPERIDINE HCL 25 MG/ML IJ SOLN: 6.2500 mg | INTRAMUSCULAR | Status: DC | PRN

## 2017-12-12 MED ORDER — NALOXONE HCL 0.4 MG/ML IJ SOLN: 0.4000 mg | INTRAMUSCULAR | Status: DC | PRN

## 2017-12-12 MED ORDER — LIDOCAINE-EPINEPHRINE (PF) 2 %-1:200000 IJ SOLN
INTRAMUSCULAR | Status: AC
  Filled 2017-12-12: qty 20

## 2017-12-12 MED ORDER — BENZOCAINE-MENTHOL 20-0.5 % EX AERO: 1.0000 "application " | INHALATION_SPRAY | CUTANEOUS | Status: DC | PRN

## 2017-12-12 SURGICAL SUPPLY — 23 items
BLADE SURG 11 STRL SS (BLADE) ×2 IMPLANT
CLOTH BEACON ORANGE TIMEOUT ST (SAFETY) ×2 IMPLANT
DRESSING OPSITE X SMALL 2X3 (GAUZE/BANDAGES/DRESSINGS) ×2 IMPLANT
DRSG OPSITE POSTOP 3X4 (GAUZE/BANDAGES/DRESSINGS) ×2 IMPLANT
DURAPREP 26ML APPLICATOR (WOUND CARE) ×2 IMPLANT
GLOVE BIOGEL PI IND STRL 7.0 (GLOVE) ×1 IMPLANT
GLOVE BIOGEL PI IND STRL 7.5 (GLOVE) ×1 IMPLANT
GLOVE BIOGEL PI INDICATOR 7.0 (GLOVE) ×1
GLOVE BIOGEL PI INDICATOR 7.5 (GLOVE) ×1
GLOVE ECLIPSE 7.5 STRL STRAW (GLOVE) ×2 IMPLANT
GOWN STRL REUS W/TWL LRG LVL3 (GOWN DISPOSABLE) ×4 IMPLANT
NEEDLE HYPO 22GX1.5 SAFETY (NEEDLE) ×2 IMPLANT
NS IRRIG 1000ML POUR BTL (IV SOLUTION) ×2 IMPLANT
PACK ABDOMINAL MINOR (CUSTOM PROCEDURE TRAY) ×2 IMPLANT
PROTECTOR NERVE ULNAR (MISCELLANEOUS) ×2 IMPLANT
SPONGE LAP 4X18 X RAY DECT (DISPOSABLE) ×4 IMPLANT
SUT PLAIN 0 NONE (SUTURE) ×4 IMPLANT
SUT VICRYL 0 UR6 27IN ABS (SUTURE) ×2 IMPLANT
SUT VICRYL 4-0 PS2 18IN ABS (SUTURE) ×2 IMPLANT
SYR CONTROL 10ML LL (SYRINGE) ×2 IMPLANT
TOWEL OR 17X24 6PK STRL BLUE (TOWEL DISPOSABLE) ×4 IMPLANT
TRAY FOLEY CATH SILVER 14FR (SET/KITS/TRAYS/PACK) ×2 IMPLANT
WATER STERILE IRR 1000ML POUR (IV SOLUTION) ×2 IMPLANT

## 2017-12-12 NOTE — Anesthesia Procedure Notes (Signed)
Epidural Patient location during procedure: OB Start time: 12/12/2017 1:27 PM End time: 12/12/2017 1:37 PM  Staffing Anesthesiologist: Achille RichHodierne, Kadia Abaya, MD Performed: anesthesiologist   Preanesthetic Checklist Completed: patient identified, site marked, pre-op evaluation, timeout performed, IV checked, risks and benefits discussed and monitors and equipment checked  Epidural Patient position: sitting Prep: DuraPrep Patient monitoring: heart rate, cardiac monitor, continuous pulse ox and blood pressure Approach: midline Location: L2-L3 Injection technique: LOR saline  Needle:  Needle type: Tuohy  Needle gauge: 17 G Needle length: 9 cm Needle insertion depth: 5 cm Catheter type: closed end flexible Catheter size: 19 Gauge Catheter at skin depth: 12 cm Test dose: negative and Other  Assessment Events: blood not aspirated, injection not painful, no injection resistance and negative IV test  Additional Notes Informed consent obtained prior to proceeding including risk of failure, 1% risk of PDPH, risk of minor discomfort and bruising.  Discussed rare but serious complications including epidural abscess, permanent nerve injury, epidural hematoma.  Discussed alternatives to epidural analgesia and patient desires to proceed.  Timeout performed pre-procedure verifying patient name, procedure, and platelet count.  Patient tolerated procedure well. Reason for block:procedure for pain

## 2017-12-12 NOTE — Op Note (Signed)
Daisy Marquez 12/12/2017 6:18 PM  PREOPERATIVE DIAGNOSIS:  Undesired fertility  POSTOPERATIVE DIAGNOSIS:  Undesired fertility  PROCEDURE:  Postpartum Bilateral Tubal Sterilization using Pomeroy method   SURGEON: Surgeon(s) and Role:    * Stinson, Rhona RaiderJacob J, DO - Primary    * Jacoby Ritsema, Kandra NicolasJulie P, MD - Fellow   ANESTHESIA:  Epidural  COMPLICATIONS:  None immediate.  ESTIMATED BLOOD LOSS:  Less than 20cc.  FLUIDS: 200 cc LR.  URINE OUTPUT:  100 cc of clear urine.  INDICATIONS: 27 y.o. yo [id number]  with undesired fertility,status post vaginal delivery, desires permanent sterilization. Risks and benefits of procedure discussed with patient including permanence of method, bleeding, infection, injury to surrounding organs and need for additional procedures. Risk failure of 0.5-1% with increased risk of ectopic gestation if pregnancy occurs was also discussed with patient.   FINDINGS:  Normal uterus, tubes, and ovaries.  TECHNIQUE: After informed consent was obtained, the patient was taken to the operating room where anesthesia was induced and found to be adequate. A small transverse, infraumbilical skin incision was made with the scalpel. This incision was carried down to the underlying layer of fascia. The fascia was grasped with Kocher clamps tented up and entered sharply with Mayo scissors. Underlying peritoneum was then identified tented up and entered sharply with Metzenbaum scissors. The fascia was tagged with 0 Vicryl. The patient's right fallopian tube was then identified, brought to the incision, and grasped with a Babcock clamp. The tube was then followed out to the fimbria. The Babcock clamp was then used to grasp the tube approximately 4 cm from the cornual region. A 3 cm segment of the tube was then ligated with free tie of plain gut suture, transected and excised. Good hemostasis was noted and the tube was returned to the abdomen. The left fallopian tube was then identified to its  fimbriated end, ligated, and a 3 cm segment excised in a similar fashion, at which time the one of the sutures came off. Both portions of the tubed were carefully identified, clamped across both ends including underlying mesosalpinx, the tied with plain gut suture x 2. Excellent hemostasis was noted, and the tube returned to the abdomen. The fascia was re-approximated with 0 Vicryl. The skin was closed in a subcuticular fashion with 3-0 Vicryl. Quarter percent Marcaine solution was then injected at the incision site. The patient tolerated the procedure well. Sponge, lap, and needle count were correct x2. The patient was taken to recovery room in stable condition.  [name], Kandra NicolasJulie P, MD 12/12/2017 6:18 PM

## 2017-12-12 NOTE — Anesthesia Preprocedure Evaluation (Signed)
Anesthesia Evaluation  Patient identified by MRN, date of birth, ID band Patient awake    Reviewed: Allergy & Precautions, H&P , NPO status , Patient's Chart, lab work & pertinent test results  Airway Mallampati: II   Neck ROM: full    Dental   Pulmonary neg pulmonary ROS,    breath sounds clear to auscultation       Cardiovascular hypertension,  Rhythm:regular Rate:Normal     Neuro/Psych    GI/Hepatic   Endo/Other    Renal/GU      Musculoskeletal   Abdominal   Peds  Hematology  (+) anemia ,   Anesthesia Other Findings   Reproductive/Obstetrics (+) Pregnancy                             Anesthesia Physical Anesthesia Plan  ASA: II  Anesthesia Plan: Epidural   Post-op Pain Management:    Induction: Intravenous  PONV Risk Score and Plan: 2 and Treatment may vary due to age or medical condition  Airway Management Planned: Natural Airway  Additional Equipment:   Intra-op Plan:   Post-operative Plan:   Informed Consent: I have reviewed the patients History and Physical, chart, labs and discussed the procedure including the risks, benefits and alternatives for the proposed anesthesia with the patient or authorized representative who has indicated his/her understanding and acceptance.     Plan Discussed with: Anesthesiologist  Anesthesia Plan Comments:         Anesthesia Quick Evaluation  

## 2017-12-12 NOTE — MAU Note (Signed)
PT SAYS  AN INDUCTION ON Friday.    UC'S HURT BAD SINCE 0400.  PNC WITH  FAMINA.    VE IN OFFICE LAST WEEK-  1 CM.  DENIES HSV AND MRSA.   GBS- UNSURE.

## 2017-12-12 NOTE — H&P (Signed)
Daisy GrapesRuby Lindler is a 27 y.o. female presenting for SOL at term. Denies vaginal bleeding, leaking of fluid, decreased fetal movement, fever, falls, or recent illness.  She receives prenatal care at [facility]. Pregnancy is complicated by late prenatal care affecting pregnancy, antepartum; Hx of preeclampsia, prior pregnancy, currently pregnant; Supervision of other normal pregnancy, antepartum; Rubella non-immune status, antepartum; Obesity (BMI 30.0-34.9); Obesity affecting pregnancy, antepartum.  OB History: term SVD x 3, history incorrectly lists VBAC. Proven pelvis to 8lbs 8.9oz  OB History    Gravida  4   Para  3   Term  3   Preterm  0   AB  0   Living  3     SAB  0   TAB  0   Ectopic  0   Multiple  0   Live Births  3          Past Medical History:  Diagnosis Date  . Anemia   . Pregnancy induced hypertension   . UTI (lower urinary tract infection)    Past Surgical History:  Procedure Laterality Date  . NO PAST SURGERIES     Family History: family history includes Diabetes in her mother. Social History:  reports that she has never smoked. She has never used smokeless tobacco. She reports that she does not drink alcohol or use drugs.     Maternal Diabetes: No Genetic Screening: Normal Maternal Ultrasounds/Referrals: Normal Fetal Ultrasounds or other Referrals:  None Maternal Substance Abuse:  No Significant Maternal Medications:  None Significant Maternal Lab Results:  None Other Comments:  None  Review of Systems  Constitutional: Negative for fever.  Eyes: Negative for blurred vision and double vision.  Gastrointestinal: Positive for abdominal pain. Negative for nausea and vomiting.  All other systems reviewed and are negative.  Maternal Medical History:  Reason for admission: Nausea.    Dilation: 6 Effacement (%): 80 Station: -1, 0 Exam by:: C. Nightingale, RN Blood pressure 130/73, pulse (!) 121, last menstrual period 03/03/2017, unknown if  currently breastfeeding. Exam Physical Exam  Nursing note and vitals reviewed. Constitutional: She is oriented to person, place, and time. She appears well-developed and well-nourished.  HENT:  Head: Normocephalic.  Cardiovascular: Normal rate, regular rhythm, normal heart sounds and intact distal pulses.  Respiratory: Effort normal and breath sounds normal.  GI:  Gravid  Genitourinary: Vagina normal and uterus normal.  Musculoskeletal: Normal range of motion.  Neurological: She is alert and oriented to person, place, and time. She has normal reflexes.  Skin: Skin is warm and dry.  Psychiatric: She has a normal mood and affect. Her behavior is normal. Judgment and thought content normal.    Prenatal labs: ABO, Rh: O/Positive/-- (04/24 1115) Antibody: Negative (04/24 1115) Rubella: 0.92 (04/24 1115) RPR: Non Reactive (04/24 1115)  HBsAg: Negative (04/24 1115)  HIV: Non Reactive (04/24 1115)  GBS: Negative (06/12 1557)   Patient Vitals for the past 24 hrs:  BP Pulse  12/12/17 1217 130/73 (!) 121     Assessment/Plan: --27 y.o. G4P3003 at 3760w4d SOL --Reactive NST: baseline 135, moderate variability, positive accels, no decels --IM Stadol 1g given in MAU at 12:53pm --Toco: regular ctx q 2-3 min --GBS NEG --Rub Non-immune --[facility], may have epidural upon request  Girl/both/BTL   Calvert CantorSamantha C Weinhold, CNM 12/12/2017, 12:47 PM

## 2017-12-12 NOTE — Lactation Note (Signed)
This note was copied from a baby's chart. Lactation Consultation Note  Patient Name: Girl Joylene GrapesRuby Metallo ZOXWR'UToday's Date: 12/12/2017 Reason for consult: Initial assessment;Term  P4 mother whose infant is now 395 hours old.  Mother only wants to give colostrum in the hospital and will then switch completely to formula.  She bottle fed her other children.  Mother has no questions/concerns at this time.  Reviewed how to suppress milk when she desires to stop breastfeeding.  Mother knows to wear a supportive bra 24 hours/day for suppression.  Will call prn for questions.   Maternal Data Formula Feeding for Exclusion: No Has patient been taught Hand Expression?: Yes  Feeding Feeding Type: Formula Nipple Type: Slow - flow                  Lactation Tools Discussed/Used WIC Program: Yes   Consult Status Consult Status: PRN    Saroya Riccobono R David Rodriquez 12/12/2017, 9:03 PM

## 2017-12-12 NOTE — Progress Notes (Signed)
Pt informed she may ambulate.  Instructed to remain on the first floor and return @ 0800 for re evaluation or sooner if ROM or VB.  Pr verbalized understanding & agreeable.

## 2017-12-12 NOTE — Anesthesia Preprocedure Evaluation (Signed)
Anesthesia Evaluation  Patient identified by MRN, date of birth, ID band Patient awake    Reviewed: Allergy & Precautions, H&P , NPO status , Patient's Chart, lab work & pertinent test results  Airway Mallampati: II   Neck ROM: full    Dental   Pulmonary neg pulmonary ROS,    breath sounds clear to auscultation       Cardiovascular hypertension,  Rhythm:regular Rate:Normal     Neuro/Psych    GI/Hepatic   Endo/Other    Renal/GU      Musculoskeletal   Abdominal   Peds  Hematology   Anesthesia Other Findings   Reproductive/Obstetrics                             Anesthesia Physical Anesthesia Plan  ASA: II  Anesthesia Plan: Epidural   Post-op Pain Management:    Induction: Intravenous  PONV Risk Score and Plan: 2 and Ondansetron and Treatment may vary due to age or medical condition  Airway Management Planned: Nasal Cannula  Additional Equipment:   Intra-op Plan:   Post-operative Plan:   Informed Consent: I have reviewed the patients History and Physical, chart, labs and discussed the procedure including the risks, benefits and alternatives for the proposed anesthesia with the patient or authorized representative who has indicated his/her understanding and acceptance.     Plan Discussed with: CRNA, Anesthesiologist and Surgeon  Anesthesia Plan Comments:         Anesthesia Quick Evaluation

## 2017-12-12 NOTE — Discharge Instructions (Signed)
Braxton Hicks Contractions °Contractions of the uterus can occur throughout pregnancy, but they are not always a sign that you are in labor. You may have practice contractions called Braxton Hicks contractions. These false labor contractions are sometimes confused with true labor. °What are Braxton Hicks contractions? °Braxton Hicks contractions are tightening movements that occur in the muscles of the uterus before labor. Unlike true labor contractions, these contractions do not result in opening (dilation) and thinning of the cervix. Toward the end of pregnancy (32-34 weeks), Braxton Hicks contractions can happen more often and may become stronger. These contractions are sometimes difficult to tell apart from true labor because they can be very uncomfortable. You should not feel embarrassed if you go to the hospital with false labor. °Sometimes, the only way to tell if you are in true labor is for your health care provider to look for changes in the cervix. The health care provider will do a physical exam and may monitor your contractions. If you are not in true labor, the exam should show that your cervix is not dilating and your water has not broken. °If there are other health problems associated with your pregnancy, it is completely safe for you to be sent home with false labor. You may continue to have Braxton Hicks contractions until you go into true labor. °How to tell the difference between true labor and false labor °True labor °· Contractions last 30-70 seconds. °· Contractions become very regular. °· Discomfort is usually felt in the top of the uterus, and it spreads to the lower abdomen and low back. °· Contractions do not go away with walking. °· Contractions usually become more intense and increase in frequency. °· The cervix dilates and gets thinner. °False labor °· Contractions are usually shorter and not as strong as true labor contractions. °· Contractions are usually irregular. °· Contractions  are often felt in the front of the lower abdomen and in the groin. °· Contractions may go away when you walk around or change positions while lying down. °· Contractions get weaker and are shorter-lasting as time goes on. °· The cervix usually does not dilate or become thin. °Follow these instructions at home: °· Take over-the-counter and prescription medicines only as told by your health care provider. °· Keep up with your usual exercises and follow other instructions from your health care provider. °· Eat and drink lightly if you think you are going into labor. °· If Braxton Hicks contractions are making you uncomfortable: °? Change your position from lying down or resting to walking, or change from walking to resting. °? Sit and rest in a tub of warm water. °? Drink enough fluid to keep your urine pale yellow. Dehydration may cause these contractions. °? Do slow and deep breathing several times an hour. °· Keep all follow-up prenatal visits as told by your health care provider. This is important. °Contact a health care provider if: °· You have a fever. °· You have continuous pain in your abdomen. °Get help right away if: °· Your contractions become stronger, more regular, and closer together. °· You have fluid leaking or gushing from your vagina. °· You pass blood-tinged mucus (bloody show). °· You have bleeding from your vagina. °· You have low back pain that you never had before. °· You feel your baby’s head pushing down and causing pelvic pressure. °· Your baby is not moving inside you as much as it used to. °Summary °· Contractions that occur before labor are called Braxton   Hicks contractions, false labor, or practice contractions. °· Braxton Hicks contractions are usually shorter, weaker, farther apart, and less regular than true labor contractions. True labor contractions usually become progressively stronger and regular and they become more frequent. °· Manage discomfort from Braxton Hicks contractions by  changing position, resting in a warm bath, drinking plenty of water, or practicing deep breathing. °This information is not intended to replace advice given to you by your health care provider. Make sure you discuss any questions you have with your health care provider. °Document Released: 09/29/2016 Document Revised: 09/29/2016 Document Reviewed: 09/29/2016 °Elsevier Interactive Patient Education © 2018 [employer] ° °

## 2017-12-12 NOTE — Anesthesia Postprocedure Evaluation (Signed)
Anesthesia Post Note  Patient: Daisy Marquez  Procedure(s) Performed: POST PARTUM TUBAL LIGATION (Bilateral Abdomen)     Patient location during evaluation: Mother Baby Anesthesia Type: Epidural Level of consciousness: awake and alert Pain management: pain level controlled Vital Signs Assessment: post-procedure vital signs reviewed and stable Respiratory status: spontaneous breathing, nonlabored ventilation and respiratory function stable Cardiovascular status: stable Postop Assessment: no headache, no backache and epidural receding Anesthetic complications: no    Last Vitals:  Vitals:   12/12/17 1900 12/12/17 1915  BP: 126/77 134/70  Pulse: (!) 57 (!) 52  Resp: 17 13  Temp: 36.9 C   SpO2: 97%     Last Pain:  Vitals:   12/12/17 1900  TempSrc:   PainSc: 0-No pain   Pain Goal:                 [name]

## 2017-12-12 NOTE — Progress Notes (Signed)
I have communicated with Dr. Nira Retortegele and reviewed vital signs:  Vitals:   12/12/17 0631 12/12/17 0857  BP: 118/61 131/70  Pulse: 78 68  Resp: 18 17  Temp: (!) 97.5 F (36.4 C) 97.7 F (36.5 C)  SpO2:  100%    Vaginal exam:  Dilation: 3.5 Effacement (%): 50 Cervical Position: Middle Station: -2 Presentation: Vertex Exam by:: [place], RN,   Also reviewed contraction pattern and that non-stress test is reactive.  It has been documented that patient is contracting every 2-3 minutes with minimal cervical change over 1 hour and 20 minutes, not indicating active labor.  Patient denies any other complaints.  Based on this report provider has given order for discharge.  A discharge order and diagnosis entered by a provider.   Labor discharge instructions reviewed with patient.

## 2017-12-12 NOTE — Progress Notes (Signed)
Patient ID: Joylene GrapesRuby Marquez, female   DOB: [birth year], 27 y.o.   MRN: [id number]  Risks of procedure discussed with patient including but not limited to: risk of regret, permanence of method, bleeding, infection, injury to surrounding organs and need for additional procedures.  Failure risk of 1 -2 % with increased risk of ectopic gestation if pregnancy occurs was also discussed with patient.    Levie HeritageStinson, Daisy Wildes Marquez, Daisy Marquez 12/12/2017 4:08 PM

## 2017-12-12 NOTE — Transfer of Care (Signed)
Immediate Anesthesia Transfer of Care Note  Patient: Daisy Marquez  Procedure(s) Performed: POST PARTUM TUBAL LIGATION (Bilateral Abdomen)  Patient Location: PACU  Anesthesia Type:Epidural  Level of Consciousness: awake and alert   Airway & Oxygen Therapy: Patient Spontanous Breathing  Post-op Assessment: Report given to RN and Post -op Vital signs reviewed and stable  Post vital signs: Reviewed  Last Vitals:  Vitals Value Taken Time  BP    Temp    Pulse    Resp    SpO2      Last Pain:  Vitals:   12/12/17 1645  TempSrc:   PainSc: 0-No pain         Complications: No apparent anesthesia complications

## 2017-12-12 NOTE — MAU Note (Signed)
Pt presents with worsening contractions.  

## 2017-12-13 ENCOUNTER — Encounter (HOSPITAL_COMMUNITY): Payer: Self-pay | Admitting: Family Medicine

## 2017-12-13 LAB — RPR: RPR Ser Ql: NONREACTIVE

## 2017-12-13 MED ORDER — IBUPROFEN 600 MG PO TABS: 600.0000 mg | ORAL_TABLET | Freq: Four times a day (QID) | ORAL | 0 refills | Status: AC | PRN

## 2017-12-13 NOTE — Discharge Summary (Signed)
OB Discharge Summary     Patient Name: Daisy Marquez DOB: 08-06-1990 MRN: [id number]  Date of admission: 12/12/2017 Delivering MD: Clayton Bibles C   Date of discharge: 12/13/2017  Admitting diagnosis: LABOR Intrauterine pregnancy: [redacted]w[redacted]d     Secondary diagnosis:  Active Problems:   Pregnancy  Additional problems: none     Discharge diagnosis: Term Pregnancy Delivered and PP BTL                                                                                                Post partum procedures:postpartum tubal ligation  Augmentation: none  Complications: None  Hospital course:  Onset of Labor With Vaginal Delivery     27 y.o. yo [id number] at 103w4d was admitted in Active Labor on 12/12/2017. Patient had an uncomplicated labor course as follows:  Membrane Rupture Time/Date: 2:57 PM ,12/12/2017   Intrapartum Procedures: Episiotomy: None [1]                                         Lacerations:  None [1]  Patient had a delivery of a Viable infant. 12/12/2017  Information for the patient's newborn:  Arlis, Everly [[id number]]  Delivery Method: Vaginal, Spontaneous(Filed from Delivery Summary)    Pateint had an uncomplicated postpartum course.  She is ambulating, tolerating a regular diet, passing flatus, and urinating well. She desires early d/c as today is her other child's birthday. Patient is discharged home in stable condition on 12/13/17.   Physical exam  Vitals:   12/12/17 2045 12/13/17 0030 12/13/17 0427 12/13/17 0535  BP: 135/84 131/79 132/82   Pulse: (!) 57 (!) 55 (!) 53   Resp: 18 18 18    Temp: 98 F (36.7 C) (!) 97.2 F (36.2 C) 97.6 F (36.4 C) 98.3 F (36.8 C)  TempSrc: Oral Oral Oral Oral  SpO2: 98% 100% 100%    General: alert, cooperative and no distress Lochia: appropriate Uterine Fundus: firm Incision: Healing well with no significant drainage, No significant erythema, Dressing is clean, dry, and intact DVT Evaluation: No evidence of DVT seen  on physical exam. Negative Homan's sign. No cords or calf tenderness. No significant calf/ankle edema. Labs: Lab Results  Component Value Date   WBC 12.1 (H) 12/12/2017   HGB 12.8 12/12/2017   HCT 39.6 12/12/2017   MCV 87.4 12/12/2017   PLT 240 12/12/2017   CMP Latest Ref Rng & Units 10/09/2016  Glucose 65 - 99 mg/dL 027(O)  BUN 6 - 20 mg/dL 10  Creatinine 5.36 - 6.44 mg/dL 0.34(V)  Sodium 425 - 956 mmol/L 136  Potassium 3.5 - 5.1 mmol/L 4.0  Chloride 101 - 111 mmol/L 108  CO2 22 - 32 mmol/L 19(L)  Calcium 8.9 - 10.3 mg/dL 8.2(L)  Total Protein 6.5 - 8.1 g/dL 6.4(L)  Total Bilirubin 0.3 - 1.2 mg/dL 0.3  Alkaline Phos 38 - 126 U/L 120  AST 15 - 41 U/L 18  ALT 14 - 54 U/L 13(L)    Discharge instruction: per  After Visit Summary and "Baby and Me Booklet".  After visit meds:  Allergies as of 12/13/2017   No Known Allergies     Medication List    STOP taking these medications   aspirin 81 MG chewable tablet     TAKE these medications   ibuprofen 600 MG tablet Commonly known as:  ADVIL,MOTRIN Take 1 tablet (600 mg total) by mouth every 6 (six) hours as needed for mild pain, moderate pain or cramping.   PRENATE PIXIE 10-0.6-0.4-200 MG Caps Take 1 tablet by mouth daily.       Diet: routine diet  Activity: Advance as tolerated. Pelvic rest for 6 weeks.   Outpatient follow up:4-6wks Follow up Appt: Future Appointments  Date Time Provider Department Center  12/14/2017 10:45 AM Conan Bowensavis, Kelly M, MD [facility] None   Follow up Visit:No follow-ups on file.  Postpartum contraception: Tubal Ligation  Newborn Data: Live born female  Birth Weight: 6 lb 13 oz (3090 g) APGAR: 9, 9  Newborn Delivery   Birth date/time:  12/12/2017 15:29:00 Delivery type:  Vaginal, Spontaneous     Baby Feeding: Bottle and Breast Disposition:home with mother after 24hr labs later this afternoon   12/13/2017 Cheral MarkerKimberly R Xabi Wittler, CNM

## 2017-12-13 NOTE — Progress Notes (Signed)
Patient is non immune to [place]. Rubella info is given to and discussed with patient. Patient declined Rubella vaccine.

## 2017-12-13 NOTE — Progress Notes (Addendum)
Patient ID: Jenaye Rickert, female   DOB: 11/23/1990, 27 y.o.   MRN: [id number]   POSTPARTUM PROGRESS NOTE  Post Partum Day 1 Subjective:  Carie Kapuscinski is a 27 y.o. [id number] 71w4ds/p SVD and BTL yesterday. Patient has a hx of pre-eclampsia with a previous pregnancy, however her BPs have been mostly in the 120s-130s.  No acute events overnight.  Pt denies problems with ambulating, voiding or po intake.  She denies nausea or vomiting. Patient endorses abdominal pain at a 4/10 that has been relieved with tylenol, oxycodone, and a heating pad. Pain is well controlled otherwise. Lochia Minimal. Patient would like to go home today.  Objective: Blood pressure 132/82, pulse (!) 53, temperature 98.3 F (36.8 C), temperature source Oral, resp. rate 18, last menstrual period 03/03/2017, SpO2 100 %, unknown if currently breastfeeding.  Physical Exam:  General: alert, cooperative and no distress Lochia:normal flow Chest: no respiratory distress Heart:regular rate, distal pulses intact Abdomen: soft, nontender,  Uterine Fundus: firm, appropriately tender DVT Evaluation: No calf swelling or tenderness Extremities: no edema  Recent Labs    12/12/17 1245  HGB 12.8  HCT 39.6    Vitals:   12/12/17 2045 12/13/17 0030 12/13/17 0427 12/13/17 0535  BP: 135/84 131/79 132/82   Pulse: (!) 57 (!) 55 (!) 53   Resp: 18 18 18    Temp: 98 F (36.7 C) (!) 97.2 F (36.2 C) 97.6 F (36.4 C) 98.3 F (36.8 C)  TempSrc: Oral Oral Oral Oral  SpO2: 98% 100% 100%     Assessment/Plan:  ASSESSMENT: RLucyle Alumbaughis a 27y.o. [id number]/p SVD and BTL yesterday  Feeding: Breast and Bottle Contraception: BTL Patient to receive MMR vaccination today Plan to discharge home     LOS: 1 day   LiSherie Don/17/2019, 7:12 AM

## 2017-12-13 NOTE — Discharge Instructions (Signed)
Breastfeeding Choosing to breastfeed is one of the best decisions you can make for yourself and your baby. A change in hormones during pregnancy causes your breasts to make breast milk in your milk-producing glands. Hormones prevent breast milk from being released before your baby is born. They also prompt milk flow after birth. Once breastfeeding has begun, thoughts of your baby, as well as his or her sucking or crying, can stimulate the release of milk from your milk-producing glands. Benefits of breastfeeding Research shows that breastfeeding offers many health benefits for infants and mothers. It also offers a cost-free and convenient way to feed your baby. For your baby  Your first milk (colostrum) helps your baby's digestive system to function better.  Special cells in your milk (antibodies) help your baby to fight off infections.  Breastfed babies are less likely to develop asthma, allergies, obesity, or type 2 diabetes. They are also at lower risk for sudden infant death syndrome (SIDS).  Nutrients in breast milk are better able to meet your babys needs compared to infant formula.  Breast milk improves your baby's brain development. For you  Breastfeeding helps to create a very special bond between you and your baby.  Breastfeeding is convenient. Breast milk costs nothing and is always available at the correct temperature.  Breastfeeding helps to burn calories. It helps you to lose the weight that you gained during pregnancy.  Breastfeeding makes your uterus return faster to its size before pregnancy. It also slows bleeding (lochia) after you give birth.  Breastfeeding helps to lower your risk of developing type 2 diabetes, osteoporosis, rheumatoid arthritis, cardiovascular disease, and breast, ovarian, uterine, and endometrial cancer later in life. Breastfeeding basics Starting breastfeeding  Find a comfortable place to sit or lie down, with your neck and back  well-supported.  Place a pillow or a rolled-up blanket under your baby to bring him or her to the level of your breast (if you are seated). Nursing pillows are specially designed to help support your arms and your baby while you breastfeed.  Make sure that your baby's tummy (abdomen) is facing your abdomen.  Gently massage your breast. With your fingertips, massage from the outer edges of your breast inward toward the nipple. This encourages milk flow. If your milk flows slowly, you may need to continue this action during the feeding.  Support your breast with 4 fingers underneath and your thumb above your nipple (make the letter "C" with your hand). Make sure your fingers are well away from your nipple and your babys mouth.  Stroke your baby's lips gently with your finger or nipple.  When your baby's mouth is open wide enough, quickly bring your baby to your breast, placing your entire nipple and as much of the areola as possible into your baby's mouth. The areola is the colored area around your nipple. ? More areola should be visible above your baby's upper lip than below the lower lip. ? Your baby's lips should be opened and extended outward (flanged) to ensure an adequate, comfortable latch. ? Your baby's tongue should be between his or her lower gum and your breast.  Make sure that your baby's mouth is correctly positioned around your nipple (latched). Your baby's lips should create a seal on your breast and be turned out (everted).  It is common for your baby to suck about 2-3 minutes in order to start the flow of breast milk. Latching Teaching your baby how to latch onto your breast properly is very  important. An improper latch can cause nipple pain, decreased milk supply, and poor weight gain in your baby. Also, if your baby is not latched onto your nipple properly, he or she may swallow some air during feeding. This can make your baby fussy. Burping your baby when you switch breasts  during the feeding can help to get rid of the air. However, teaching your baby to latch on properly is still the best way to prevent fussiness from swallowing air while breastfeeding. °Signs that your baby has successfully latched onto your nipple °· Silent tugging or silent sucking, without causing you pain. Infant's lips should be extended outward (flanged). °· Swallowing heard between every 3-4 sucks once your milk has started to flow (after your let-down milk reflex occurs). °· Muscle movement above and in front of his or her ears while sucking. ° °Signs that your baby has not successfully latched onto your nipple °· Sucking sounds or smacking sounds from your baby while breastfeeding. °· Nipple pain. ° °If you think your baby has not latched on correctly, slip your finger into the corner of your baby’s mouth to break the suction and place it between your baby's gums. Attempt to start breastfeeding again. °Signs of successful breastfeeding °Signs from your baby °· Your baby will gradually decrease the number of sucks or will completely stop sucking. °· Your baby will fall asleep. °· Your baby's body will relax. °· Your baby will retain a small amount of milk in his or her mouth. °· Your baby will let go of your breast by himself or herself. ° °Signs from you °· Breasts that have increased in firmness, weight, and size 1-3 hours after feeding. °· Breasts that are softer immediately after breastfeeding. °· Increased milk volume, as well as a change in milk consistency and color by the fifth day of breastfeeding. °· Nipples that are not sore, cracked, or bleeding. ° °Signs that your baby is getting enough milk °· Wetting at least 1-2 diapers during the first 24 hours after birth. °· Wetting at least 5-6 diapers every 24 hours for the first week after birth. The urine should be clear or pale yellow by the age of 5 days. °· Wetting 6-8 diapers every 24 hours as your baby continues to grow and develop. °· At least 3  stools in a 24-hour period by the age of 5 days. The stool should be soft and yellow. °· At least 3 stools in a 24-hour period by the age of 7 days. The stool should be seedy and yellow. °· No loss of weight greater than 10% of birth weight during the first 3 days of life. °· Average weight gain of 4-7 oz (113-198 g) per week after the age of 4 days. °· Consistent daily weight gain by the age of 5 days, without weight loss after the age of 2 weeks. °After a feeding, your baby may spit up a small amount of milk. This is normal. °Breastfeeding frequency and duration °Frequent feeding will help you make more milk and can prevent sore nipples and extremely full breasts (breast engorgement). Breastfeed when you feel the need to reduce the fullness of your breasts or when your baby shows signs of hunger. This is called "breastfeeding on demand." Signs that your baby is hungry include: °· Increased alertness, activity, or restlessness. °· Movement of the head from side to side. °· Opening of the mouth when the corner of the mouth or cheek is stroked (rooting). °· Increased sucking sounds,   smacking lips, cooing, sighing, or squeaking. °· Hand-to-mouth movements and sucking on fingers or hands. °· Fussing or crying. ° °Avoid introducing a pacifier to your baby in the first 4-6 weeks after your baby is born. After this time, you may choose to use a pacifier. Research has shown that pacifier use during the first year of a baby's life decreases the risk of sudden infant death syndrome (SIDS). °Allow your baby to feed on each breast as long as he or she wants. When your baby unlatches or falls asleep while feeding from the first breast, offer the second breast. Because newborns are often sleepy in the first few weeks of life, you may need to awaken your baby to get him or her to feed. °Breastfeeding times will vary from baby to baby. However, the following rules can serve as a guide to help you make sure that your baby is  properly fed: °· Newborns (babies 4 weeks of age or younger) may breastfeed every 1-3 hours. °· Newborns should not go without breastfeeding for longer than 3 hours during the day or 5 hours during the night. °· You should breastfeed your baby a minimum of 8 times in a 24-hour period. ° °Breast milk pumping °Pumping and storing breast milk allows you to make sure that your baby is exclusively fed your breast milk, even at times when you are unable to breastfeed. This is especially important if you go back to work while you are still breastfeeding, or if you are not able to be present during feedings. Your lactation consultant can help you find a method of pumping that works best for you and give you guidelines about how long it is safe to store breast milk. °Caring for your breasts while you breastfeed °Nipples can become dry, cracked, and sore while breastfeeding. The following recommendations can help keep your breasts moisturized and healthy: °· Avoid using soap on your nipples. °· Wear a supportive bra designed especially for nursing. Avoid wearing underwire-style bras or extremely tight bras (sports bras). °· Air-dry your nipples for 3-4 minutes after each feeding. °· Use only cotton bra pads to absorb leaked breast milk. Leaking of breast milk between feedings is normal. °· Use lanolin on your nipples after breastfeeding. Lanolin helps to maintain your skin's normal moisture barrier. Pure lanolin is not harmful (not toxic) to your baby. You may also hand express a few drops of breast milk and gently massage that milk into your nipples and allow the milk to air-dry. ° °In the first few weeks after giving birth, some women experience breast engorgement. Engorgement can make your breasts feel heavy, warm, and tender to the touch. Engorgement peaks within 3-5 days after you give birth. The following recommendations can help to ease engorgement: °· Completely empty your breasts while breastfeeding or pumping. You  may want to start by applying warm, moist heat (in the shower or with warm, water-soaked hand towels) just before feeding or pumping. This increases circulation and helps the milk flow. If your baby does not completely empty your breasts while breastfeeding, pump any extra milk after he or she is finished. °· Apply ice packs to your breasts immediately after breastfeeding or pumping, unless this is too uncomfortable for you. To do this: °? Put ice in a plastic bag. °? Place a towel between your skin and the bag. °? Leave the ice on for 20 minutes, 2-3 times a day. °· Make sure that your baby is latched on and positioned properly while   breastfeeding.  If engorgement persists after 48 hours of following these recommendations, contact your health care provider or a Advertising copywriterlactation consultant. Overall health care recommendations while breastfeeding  Eat 3 healthy meals and 3 snacks every day. Well-nourished mothers who are breastfeeding need an additional 450-500 calories a day. You can meet this requirement by increasing the amount of a balanced diet that you eat.  Drink enough water to keep your urine pale yellow or clear.  Rest often, relax, and continue to take your prenatal vitamins to prevent fatigue, stress, and low vitamin and mineral levels in your body (nutrient deficiencies).  Do not use any products that contain nicotine or tobacco, such as cigarettes and e-cigarettes. Your baby may be harmed by chemicals from cigarettes that pass into breast milk and exposure to secondhand smoke. If you need help quitting, ask your health care provider.  Avoid alcohol.  Do not use illegal drugs or marijuana.  Talk with your health care provider before taking any medicines. These include over-the-counter and prescription medicines as well as vitamins and herbal supplements. Some medicines that may be harmful to your baby can pass through breast milk.  It is possible to become pregnant while breastfeeding. If  birth control is desired, ask your health care provider about options that will be safe while breastfeeding your baby. Where to find more information: [employer]: www.llli.org Contact a health care provider if:  You feel like you want to stop breastfeeding or have become frustrated with breastfeeding.  Your nipples are cracked or bleeding.  Your breasts are red, tender, or warm.  You have: ? Painful breasts or nipples. ? A swollen area on either breast. ? A fever or chills. ? Nausea or vomiting. ? Drainage other than breast milk from your nipples.  Your breasts do not become full before feedings by the fifth day after you give birth.  You feel sad and depressed.  Your baby is: ? Too sleepy to eat well. ? Having trouble sleeping. ? More than 71 week old and wetting fewer than 6 diapers in a 24-hour period. ? Not gaining weight by 485 days of age.  Your baby has fewer than 3 stools in a 24-hour period.  Your baby's skin or the white parts of his or her eyes become yellow. Get help right away if:  Your baby is overly tired (lethargic) and does not want to wake up and feed.  Your baby develops an unexplained fever. Summary  Breastfeeding offers many health benefits for infant and mothers.  Try to breastfeed your infant when he or she shows early signs of hunger.  Gently tickle or stroke your baby's lips with your finger or nipple to allow the baby to open his or her mouth. Bring the baby to your breast. Make sure that much of the areola is in your baby's mouth. Offer one side and burp the baby before you offer the other side.  Talk with your health care provider or lactation consultant if you have questions or you face problems as you breastfeed. This information is not intended to replace advice given to you by your health care provider. Make sure you discuss any questions you have with your health care provider. Document Released: 05/16/2005 Document  Revised: 06/17/2016 Document Reviewed: 06/17/2016 Elsevier Interactive Patient Education  2018 [employer]  Postpartum Tubal Ligation, Care After Refer to this sheet in the next few weeks. These instructions provide you with information about caring for yourself after your procedure. Your  health care provider may also give you more specific instructions. Your treatment has been planned according to current medical practices, but problems sometimes occur. Call your health care provider if you have any problems or questions after your procedure. What can I expect after the procedure? After the procedure, it is common to have:  A sore throat.  Bruising or pain in your back.  Nausea or vomiting.  Dizziness.  Mild abdominal discomfort or pain, such as cramping, gas pain, or feeling bloated.  Soreness where the incision was made.  Tiredness.  Pain in your shoulders.  Follow these instructions at home: Medicines  Take over-the-counter and prescription medicines only as told by your health care provider.  Do not take aspirin because it can cause bleeding.  Do not drive or operate heavy machinery while taking prescription pain medicine. Activity  Rest for the rest of the day.  Gradually return to your normal activities over the next few days.  Do not have sex, douche, or put a tampon or anything else in your vagina for 6 weeks or as long as told by your health care provider.  Do not lift anything that is heavier than your baby for 2 weeks or as long as told by your health care provider. Incision care  Follow instructions from your health care provider about how to take care of your incision. Make sure you: ? Wash your hands with soap and water before you change your bandage (dressing). If soap and water are not available, use hand sanitizer. ? Change your dressing as told by your health care provider. ? Leave stitches (sutures) in place. They may need to stay in place for 2  weeks or longer.  Check your incision area every day for signs of infection. Check for: ? More redness, swelling, or pain. ? More fluid or blood. ? Warmth. ? Pus or a bad smell. Other Instructions  Do not take baths, swim, or use a hot tub until your health care provider approves. You may take showers.  Keep all follow-up visits as told by your health care provider. This is important. Contact a health care provider if:  You have more redness, swelling, or pain around your incision.  Your incision feels warm to the touch.  You have pus or a bad smell coming from your incision.  The edges of your incision break open after the sutures have been removed.  Your pain does not improve after 2-3 days.  You have a rash.  You repeatedly become dizzy or lightheaded.  Your pain medicine is not helping.  You are constipated. Get help right away if:  You have a fever.  You faint.  You have pain in your abdomen that gets worse.  You have fluid or blood coming from your sutures.  You have shortness of breath or difficulty breathing.  You have chest pain or leg pain.  You have ongoing nausea or diarrhea. This information is not intended to replace advice given to you by your health care provider. Make sure you discuss any questions you have with your health care provider. Document Released: 11/15/2011 Document Revised: 10/19/2015 Document Reviewed: 04/26/2015 Elsevier Interactive Patient Education  [employer].

## 2017-12-13 NOTE — Anesthesia Postprocedure Evaluation (Signed)
Anesthesia Post Note  Patient: Alanis Kenley  Procedure(s) Performed: AN AD HOC LABOR EPIDURAL     Patient location during evaluation: Mother Baby Anesthesia Type: Epidural Level of consciousness: awake and alert Pain management: pain level controlled Vital Signs Assessment: post-procedure vital signs reviewed and stable Respiratory status: spontaneous breathing Cardiovascular status: blood pressure returned to baseline Postop Assessment: no headache, no backache, epidural receding, patient able to bend at knees, no apparent nausea or vomiting, adequate PO intake and able to ambulate Anesthetic complications: no    Last Vitals:  Vitals:   12/13/17 0535 12/13/17 0805  BP:  (!) 122/40  Pulse:  72  Resp:  18  Temp: 36.8 C 36.9 C  SpO2:      Last Pain:  Vitals:   12/13/17 0805  TempSrc: Oral  PainSc: 3    Pain Goal: Patients Stated Pain Goal: 3 (12/13/17 0805)               Cleda ClarksBrowder, Lorene Samaan R

## 2017-12-14 ENCOUNTER — Encounter: Payer: Medicaid Other | Admitting: Obstetrics and Gynecology

## 2017-12-15 ENCOUNTER — Inpatient Hospital Stay (HOSPITAL_COMMUNITY): Admission: RE | Admit: 2017-12-15 | Payer: Medicaid Other | Source: Ambulatory Visit

## 2018-01-09 ENCOUNTER — Ambulatory Visit (INDEPENDENT_AMBULATORY_CARE_PROVIDER_SITE_OTHER): Payer: Medicaid Other | Admitting: Family Medicine

## 2018-01-09 ENCOUNTER — Encounter: Payer: Self-pay | Admitting: Family Medicine

## 2018-01-09 DIAGNOSIS — Z1389 Encounter for screening for other disorder: Secondary | ICD-10-CM

## 2018-01-09 NOTE — Patient Instructions (Addendum)

## 2018-01-09 NOTE — Progress Notes (Signed)
Post Partum Exam  Daisy GrapesRuby Marquez is a 27 y.o. [id number] female who presents for a postpartum visit. She is 4 weeks postpartum following a spontaneous vaginal delivery. I have fully reviewed the prenatal and intrapartum course. The delivery was at 40 gestational weeks.  Anesthesia: epidural. Postpartum course has been uncomplicated. Baby's course has been uncomplicated. Baby is feeding by bottle - Gerber Gentle. Bleeding staining only. Bowel function is normal. Bladder function is normal. Patient is not sexually active. Contraception method is tubal ligation. Postpartum depression screening:neg I have reviewed the above and agree with the documentation here. Baby is sleeping from 2-6 daily. Has help from mom and grandma. 2 oldest are getting ready to start school. Desires IUD placement again for cycle control. Has had and tolerated well in the past.  The following portions of the patient's history were reviewed and updated as appropriate: allergies, current medications, past family history, past medical history, past social history, past surgical history and problem list. Last pap smear done 06/2016 and was Normal  Review of Systems Pertinent items noted in HPI and remainder of comprehensive ROS otherwise negative.    Objective:  Height 5' 5.5" (1.664 m), weight 178 lb 14.4 oz (81.1 kg), not currently breastfeeding.  General:  alert, cooperative and appears stated age  Lungs: normal effort  Skin: BTL incision is well healed, removed dressing today, minimal yeast infection noted  Heart:  regular rate and rhythm  Abdomen: soft, non-tender; bowel sounds normal; no masses,  no organomegaly        Assessment:    Normal  postpartum exam. Pap smear not done at today's visit.   Plan:   1. Contraception: tubal ligation 2. Pap due in 2021 3. Return for IUD for cycle control at your convenience. 4. Follow up in: 3 months for CPE or as needed.

## 2018-01-24 ENCOUNTER — Encounter (HOSPITAL_COMMUNITY): Payer: Self-pay

## 2019-02-14 IMAGING — US US MFM OB DETAIL+14 WK
1 series · 12 of 28 positions shown · non-contrast
Comparison: none

Road [HOSPITAL]

Indications
29 weeks gestation of pregnancy
Encounter for antenatal screening for
malformations
Late to prenatal care, third trimester
Poor obstetric history: Previous
preeclampsia (ASA 81mg po daily)
OB History
Blood Type:            Height:  5'5"   Weight (lb):  207       BMI:
Gravidity:    4         Term:   3
Living:       3
Fetal Evaluation
Num Of Fetuses:     1
Cardiac Activity:   Observed
Presentation:       Cephalic
Placenta:           Anterior, above cervical os
P. Cord Insertion:  Visualized, central
Amniotic Fluid
AFI FV:      Subjectively within normal limits
AFI Sum(cm)     %Tile       Largest Pocket(cm)
15.36           54
RUQ(cm)       RLQ(cm)       LUQ(cm)        LLQ(cm)
3.82
Biometry
BPD:        77  mm     G. Age:  30w 6d         71  %    CI:        85.79   %    70 - 86
FL/HC:      22.3   %    19.2 -
HC:      261.8  mm     G. Age:  28w 3d        < 3  %    HC/AC:      1.02        0.99 -
AC:      256.8  mm     G. Age:  29w 6d         45  %    FL/BPD:     75.7   %    71 - 87
FL:       58.3  mm     G. Age:  30w 3d         53  %    FL/AC:      22.7   %    20 - 24
HUM:      52.1  mm     G. Age:  30w 3d         62  %
CER:      34.4  mm     G. Age:  29w 5d         46  %
CM:        6.5  mm
Est. FW:    2222  gm      3 lb 5 oz     55  %
Gestational Age
LMP:           29w 6d        Date:  03/03/17                 EDD:   12/08/17
U/S Today:     29w 6d                                        EDD:   12/08/17
Best:          29w 6d     Det. By:  LMP  (03/03/17)          EDD:   12/08/17
Anatomy
Cranium:               Appears normal         LVOT:                   Appears normal
Cavum:                 Appears normal         Aortic Arch:            Appears normal
Ventricles:            Appears normal         Ductal Arch:            Appears normal
Choroid Plexus:        Appears normal         Diaphragm:              Appears normal
Cerebellum:            Appears normal         Stomach:                Appears normal, left
sided
Posterior Fossa:       Appears normal         Abdomen:                Appears normal
Nuchal Fold:           Not applicable (>20    Abdominal Wall:         Appears nml (cord
wks GA)                                        insert, abd wall)
Face:                  Appears normal         Cord Vessels:           Appears normal (3
(orbits and profile)                           vessel cord)
Lips:                  Appears normal         Kidneys:                Appear normal
Palate:                Appears normal         Bladder:                Appears normal
Thoracic:              Appears normal         Spine:                  Appears normal
Heart:                 Appears normal         Upper Extremities:      Appears normal
(4CH, axis, and situs
RVOT:                  Appears normal         Lower Extremities:      Appears normal
Other:  Female gender. Technically difficult due to advanced gestational age.
Cervix Uterus Adnexa
Cervix
Normal appearance by transabdominal scan.
Uterus
No abnormality visualized.
Left Ovary
Within normal limits.
Right Ovary
Cul De Sac:   No free fluid seen.
Adnexa:       No abnormality visualized.
Impression
INDICATION: 26 yr old HYR3DD3 at 78w9d for fetal anatomic
survey.

[Series 1: us mfm ob detail+14 wk · 12 of 150 slices shown]
[im 6/150]
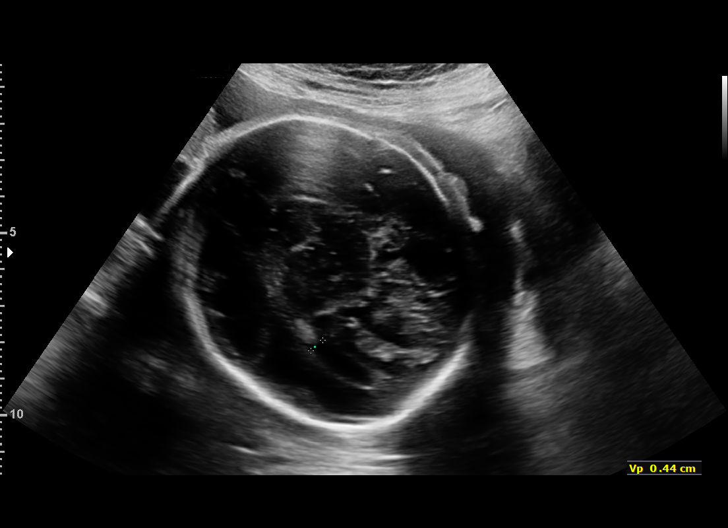
[im 17/150]
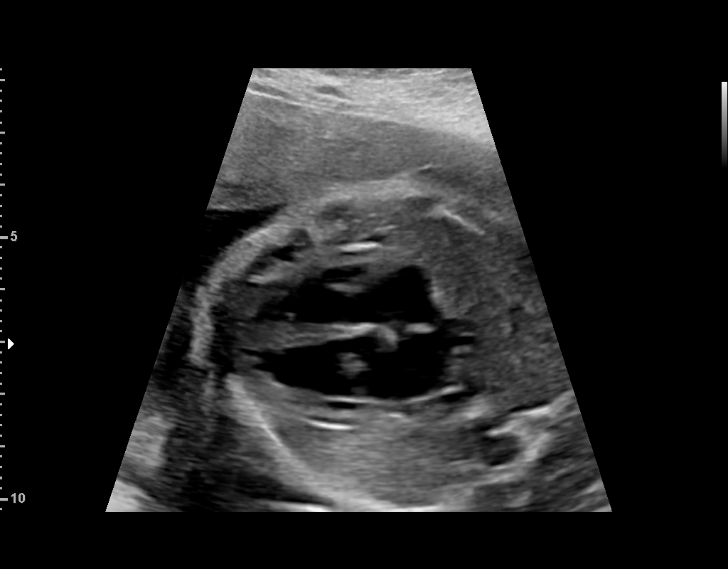
[im 28/150]
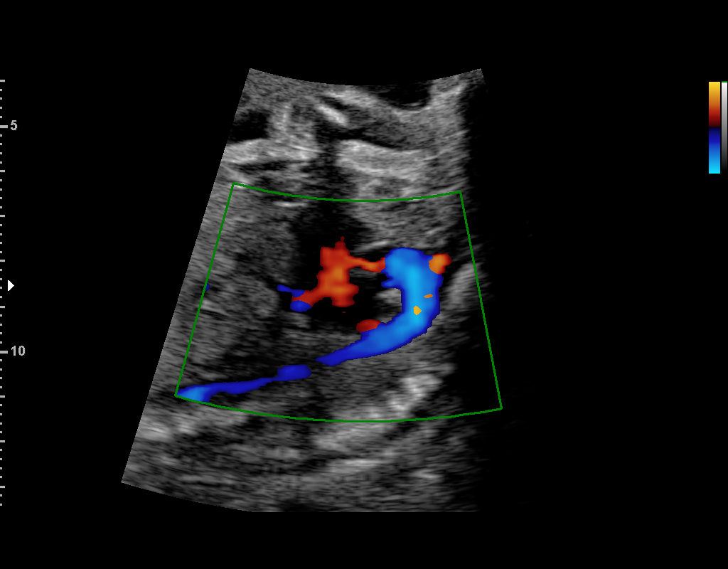
[im 45/150]
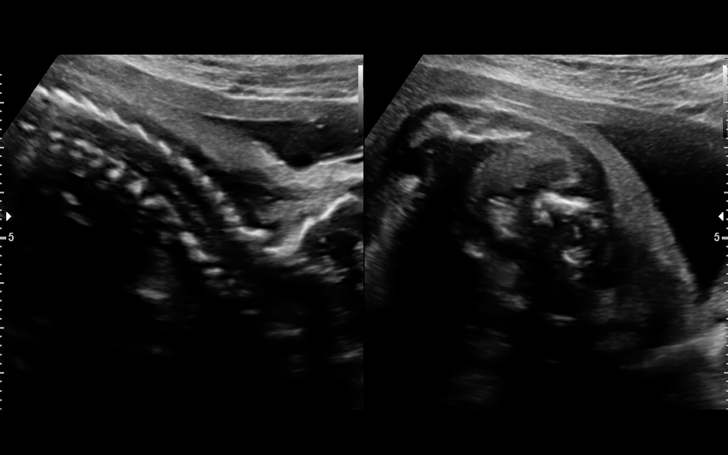
[im 56/150]
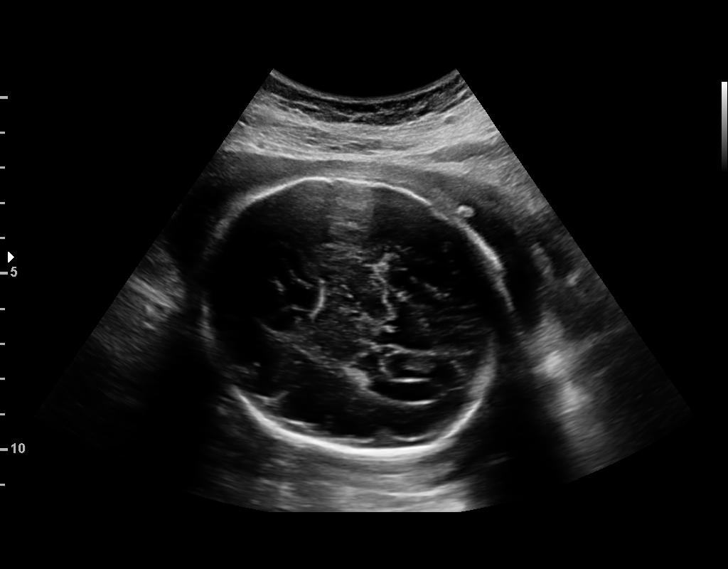
[im 67/150]
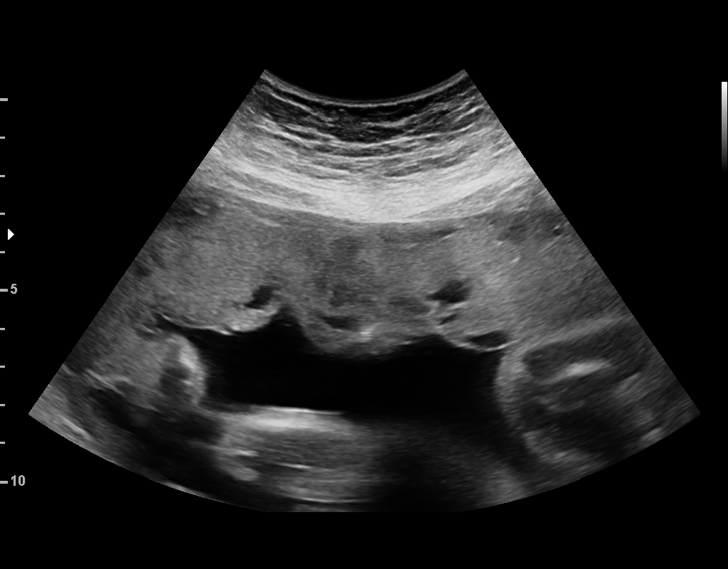
[im 83/150]
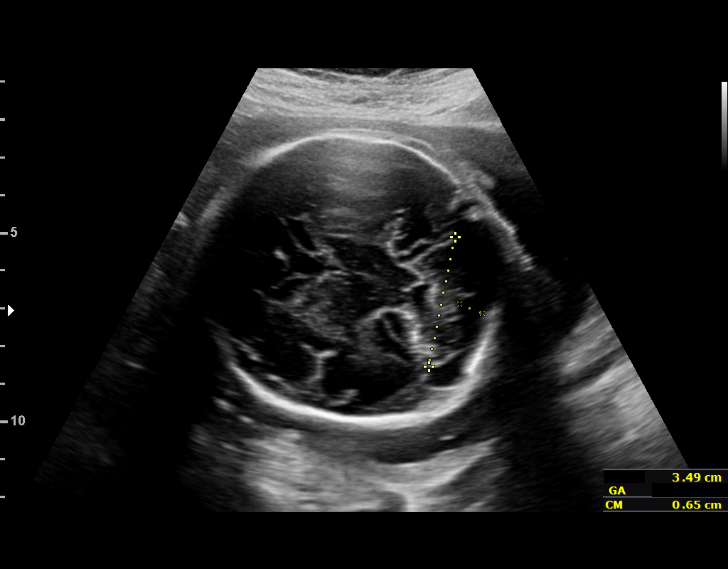
[im 94/150]
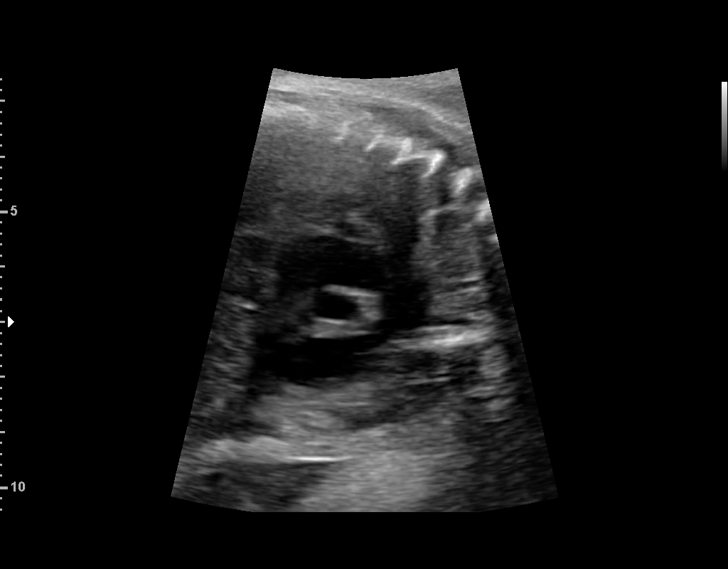
[im 105/150]
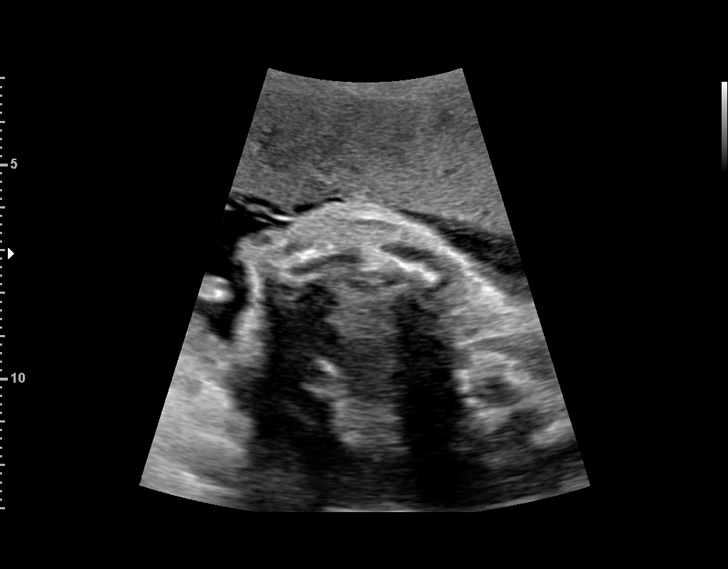
[im 122/150]
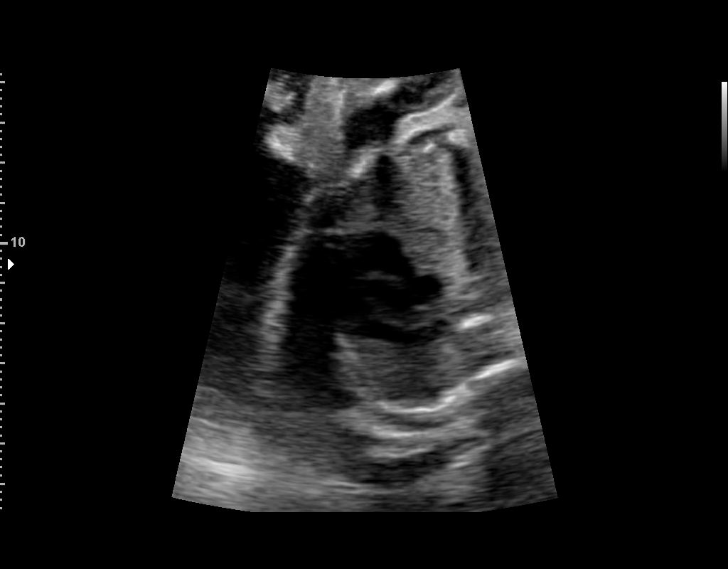
[im 133/150]
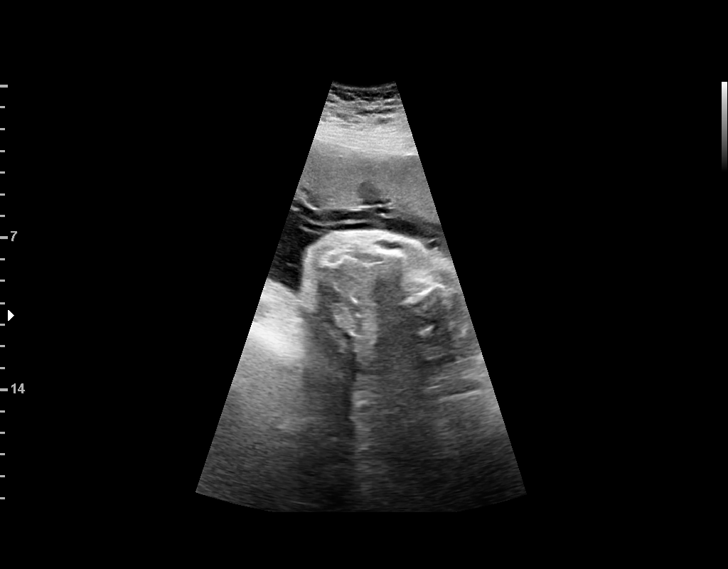
[im 144/150]
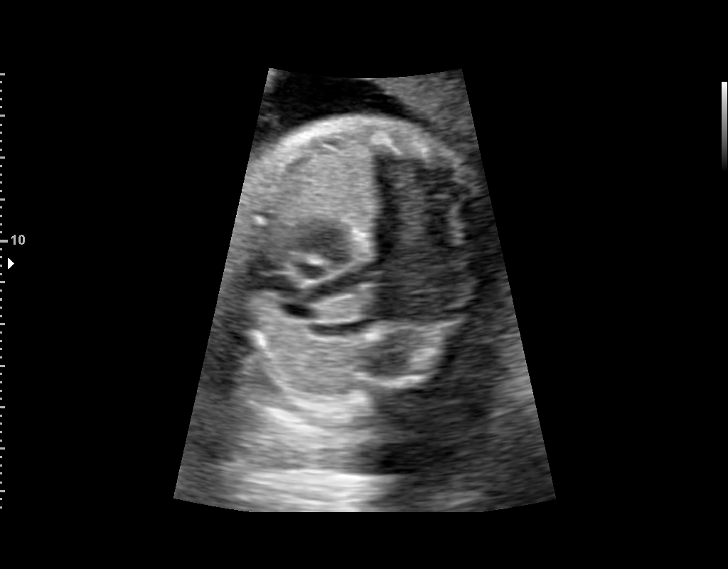

[12 of 28 positions shown; findings below may reference images not displayed]

FINDINGS: 1. Single intrauterine pregnancy with normal cardiac activity.
2. Estimated fetal weight is in the 55th%; head circumference
is in the <3rd%.
3. Anterior placenta without evidence of previa.
4. Normal amniotic fluid index.
5. Normal fetal anatomic survey.
6. Normal transabdominal cervical length.
7. No adnexal masses are seen.
Recommendations

1. Appropriate fetal growth.
2. Normal fetal anatomic survey.
3. Lagging head circumference:
- head measurements are not >2 SD below the mean so
likely constitutional
- normal intracranial anatomy
- recommend follow up in 4 weeks to assess interval growth
4. History of preeclampsia:
- recommend close surveillance for the development of
signs/symptoms of preeclampsia

## 2019-03-14 IMAGING — US US MFM OB FOLLOW-UP
1 series · 14 of 28 positions shown · non-contrast
Comparison: none

[Series 1: us mfm ob follow-up · 34 acquisitions, 14 frames shown]
[im 2/34]
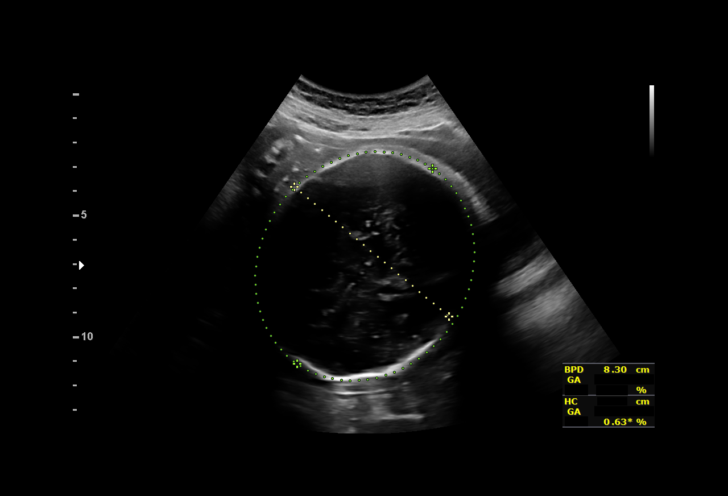
[im 4/34]
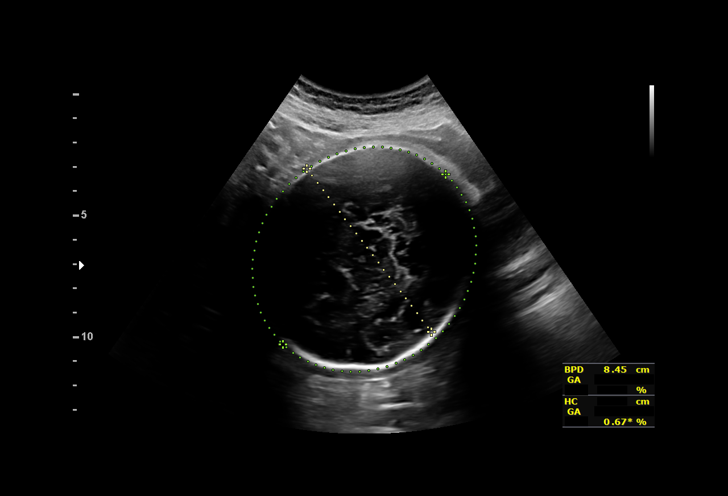
[im 7/34]
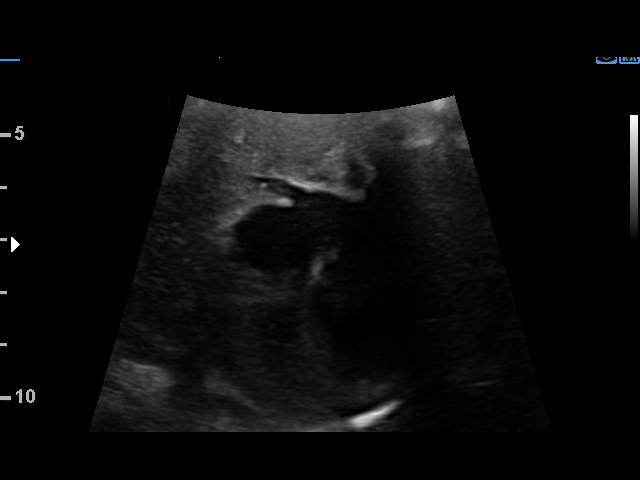
[im 9/34]
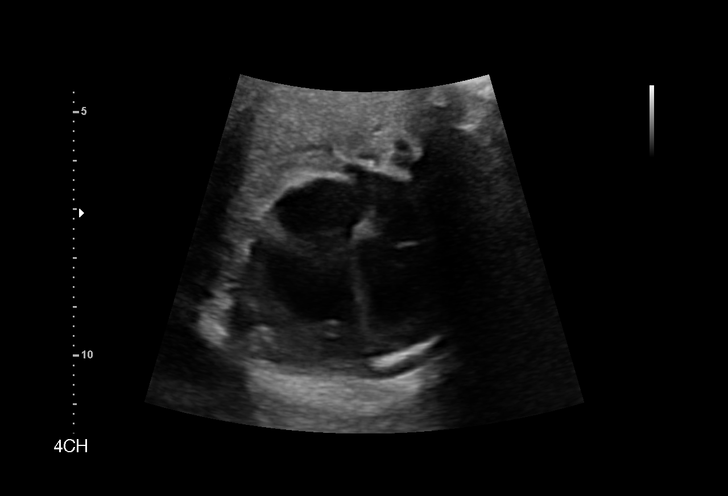
[im 12/34]
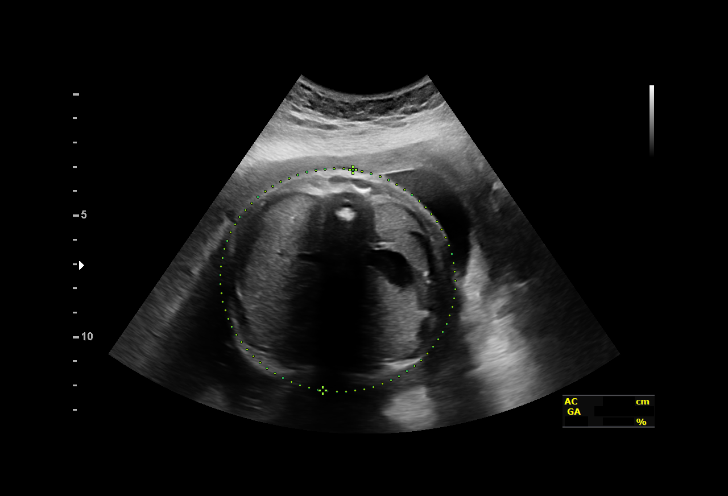
[im 14/34]
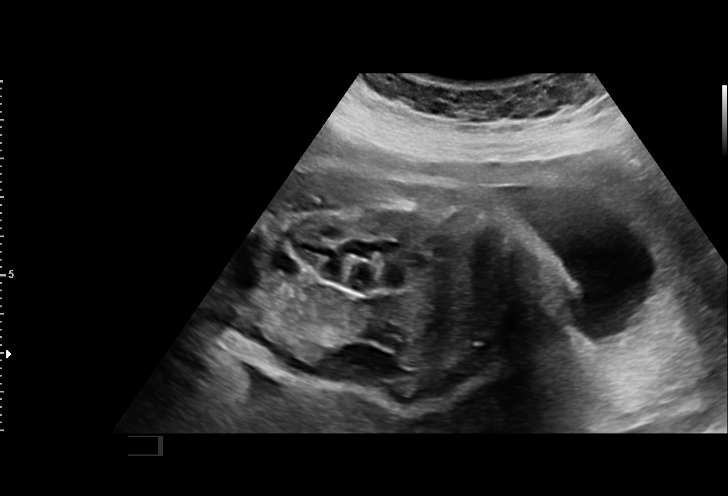
[im 16/34]
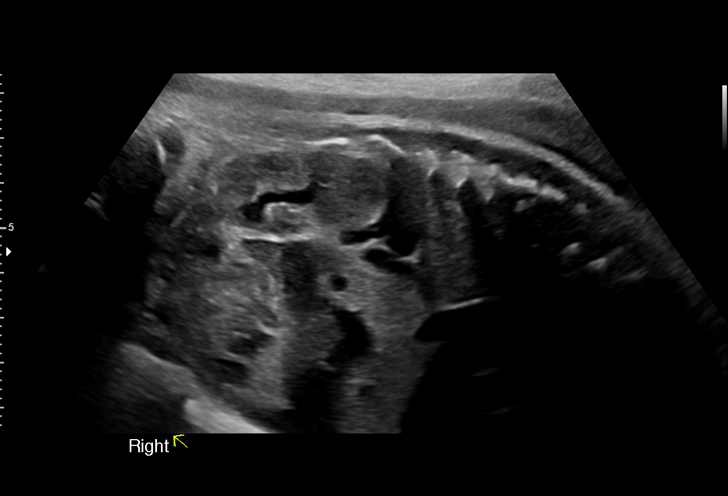
[im 19/34]
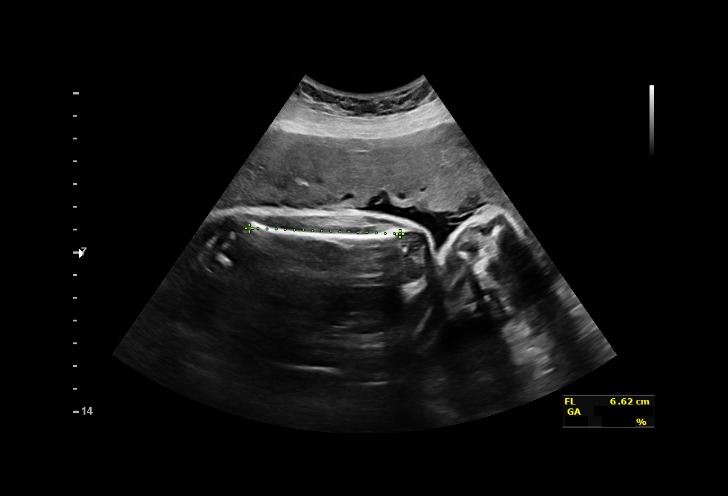
[im 21/34]
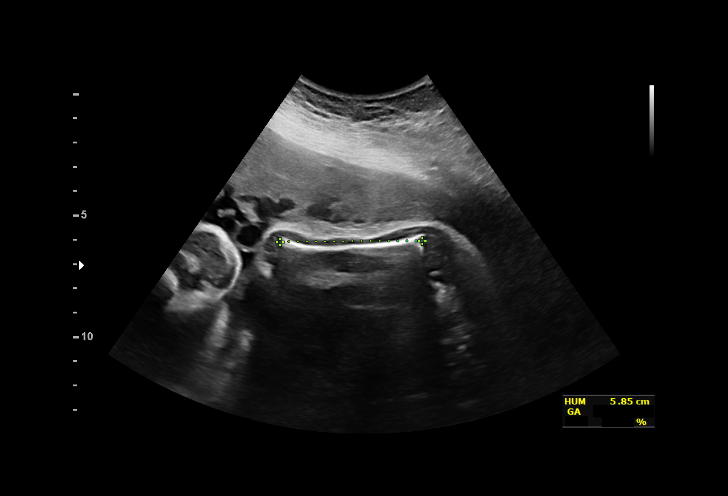
[im 24/34]
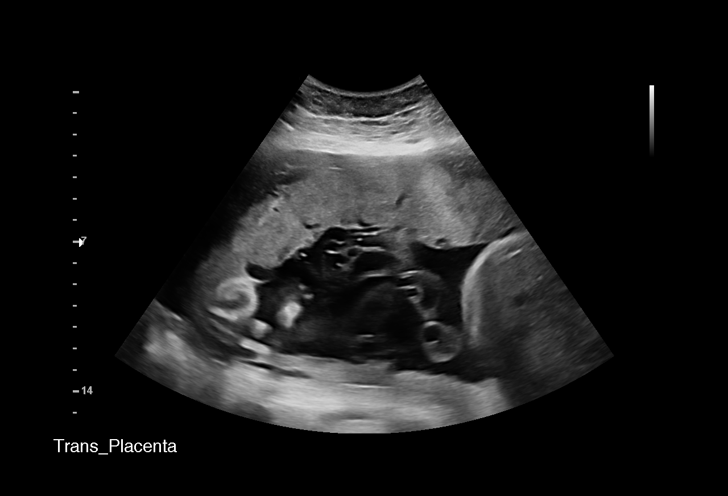
[im 26/34]
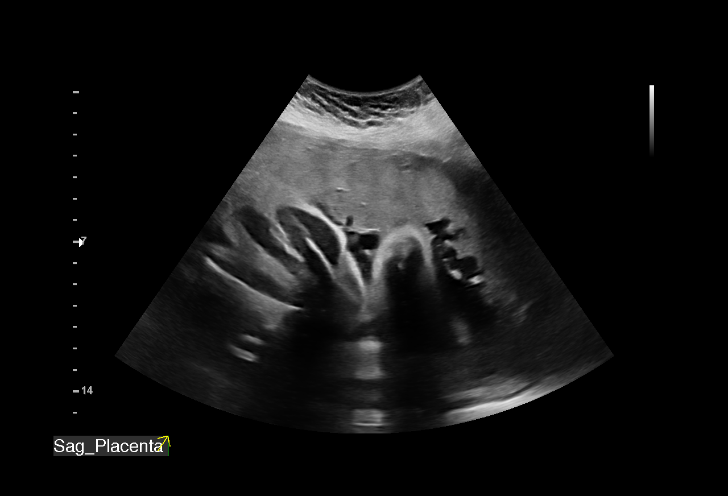
[im 29/34]
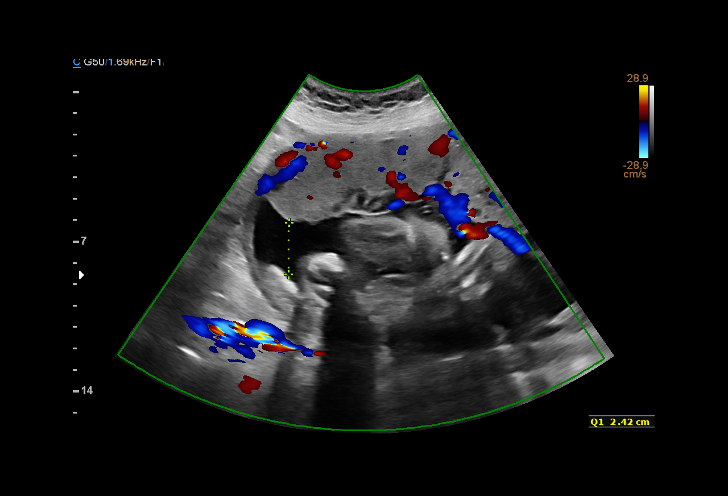
[im 31/34]
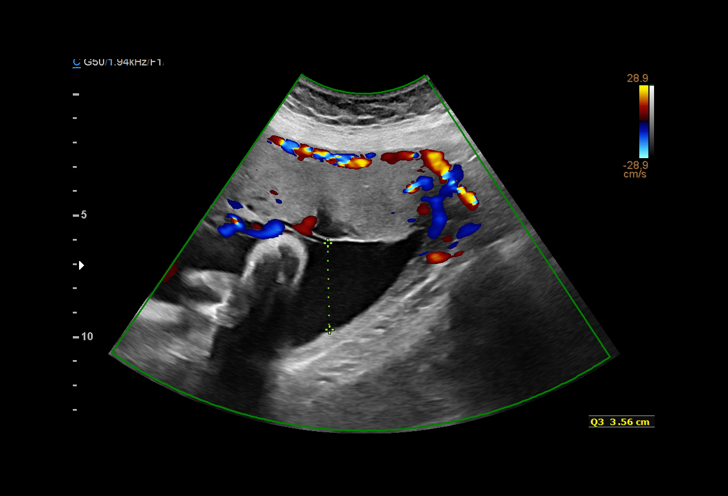
[im 34/34]
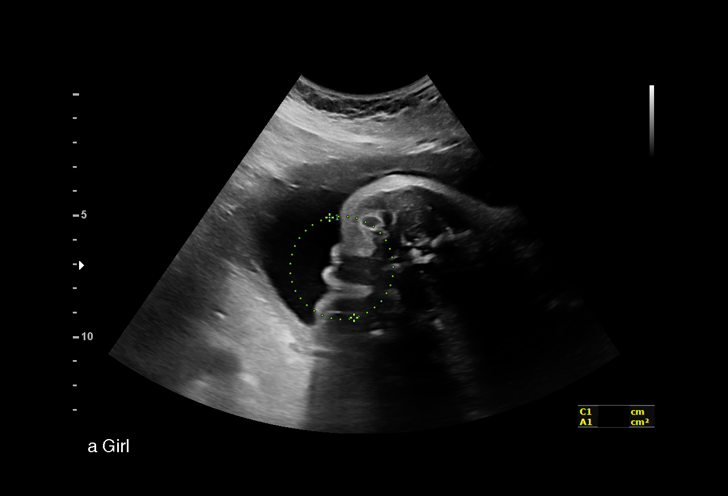

[14 of 28 positions shown; findings below may reference images not displayed]

Road [HOSPITAL]

1  SWARNAMADUSANKA AFOUNI            [id number]      [id number]     [id number]
Indications

33 weeks gestation of pregnancy
Late to prenatal care, third trimester
Poor obstetric history: Previous
preeclampsia (ASA 81mg po daily)
Encounter for other antenatal screening
follow-up
OB History

Blood Type:            Height:  5'5"   Weight (lb):  207       BMI:
Gravidity:    4         Term:   3
Living:       3
Fetal Evaluation

Num Of Fetuses:     1
Fetal Heart         148
Rate(bpm):
Cardiac Activity:   Observed
Presentation:       Cephalic
Placenta:           Anterior, above cervical os
P. Cord Insertion:  Previously Visualized

AFI Sum(cm)     %Tile       Largest Pocket(cm)
11.2            28

RUQ(cm)       RLQ(cm)       LUQ(cm)        LLQ(cm)
2.42
Biometry
BPD:      84.2  mm     G. Age:  33w 6d         48  %    CI:        83.72   %    70 - 86
FL/HC:      22.8   %    19.4 -
HC:      290.1  mm     G. Age:  31w 6d        < 3  %    HC/AC:      0.99        0.96 -
AC:      293.9  mm     G. Age:  33w 3d         40  %    FL/BPD:     78.6   %    71 - 87
FL:       66.2  mm     G. Age:  34w 1d         46  %    FL/AC:      22.5   %    20 - 24
HUM:      58.6  mm     G. Age:  33w 6d         60  %

Est. FW:    4447  gm    4 lb 14 oz      51  %
Gestational Age

LMP:           33w 6d        Date:  03/03/17                 EDD:   12/08/17
U/S Today:     33w 2d                                        EDD:   12/12/17
Best:          33w 6d     Det. By:  LMP  (03/03/17)          EDD:   12/08/17
Anatomy

Cranium:               Appears normal         LVOT:                   Previously seen
Cavum:                 Previously seen        Aortic Arch:            Previously seen
Ventricles:            Appears normal         Ductal Arch:            Previously seen
Choroid Plexus:        Previously seen        Diaphragm:              Previously seen
Cerebellum:            Previously seen        Stomach:                Appears normal, left
sided
Posterior Fossa:       Previously seen        Abdomen:                Previously seen
Nuchal Fold:           Not applicable (>20    Abdominal Wall:         Previously seen
wks GA)
Face:                  Orbits and profile     Cord Vessels:           Previously seen
previously seen
Lips:                  Previously seen        Kidneys:                Appear normal
Palate:                Previously seen        Bladder:                Appears normal
Thoracic:              Appears normal         Spine:                  Previously seen
Heart:                 Appears normal         Upper Extremities:      Previously seen
(4CH, axis, and situs
RVOT:                  Previously seen        Lower Extremities:      Previously seen

Other:  Female gender. Technically difficult due to advanced GA and fetal
position.
Cervix Uterus Adnexa

Cervix
Not visualized (advanced GA >81wks)
Impression

SIUP at 33+6 weeks
Cephalic presentation
Normal interval anatomy; anatomic survey complete
Normal amniotic fluid volume
Appropriate interval growth with EFW at the 51st %tile; HC
remains above the -2 SD and interval growth was on target
Recommendations

Follow-up as clinically indicated

## 2022-05-30 ENCOUNTER — Other Ambulatory Visit: Payer: Self-pay

## 2022-05-30 ENCOUNTER — Emergency Department (HOSPITAL_COMMUNITY)
Admission: EM | Admit: 2022-05-30 | Discharge: 2022-05-30 | Disposition: A | Discharge status: Home / Self Care | Payer: Medicaid Other | Attending: Emergency Medicine | Admitting: Emergency Medicine

## 2022-05-30 DIAGNOSIS — R1030 Lower abdominal pain, unspecified: Secondary | ICD-10-CM | POA: Insufficient documentation

## 2022-05-30 LAB — BASIC METABOLIC PANEL
Anion gap: 11 (ref 5–15)
BUN: 11 mg/dL (ref 6–20)
CO2: 23 mmol/L (ref 22–32)
Calcium: 8.8 mg/dL — ABNORMAL LOW (ref 8.9–10.3)
Chloride: 103 mmol/L (ref 98–111)
Creatinine, Ser: 0.87 mg/dL (ref 0.44–1.00)
GFR, Estimated: >60 mL/min (ref >60)
Glucose, Bld: 101 mg/dL — ABNORMAL HIGH (ref 70–99)
Potassium: 3.6 mmol/L (ref 3.5–5.1)
Sodium: 137 mmol/L (ref 135–145)

## 2022-05-30 LAB — I-STAT BETA HCG BLOOD, ED (MC, WL, AP ONLY): I-stat hCG, quantitative: <5 m[IU]/mL (ref <5)

## 2022-05-30 LAB — CBC WITH DIFFERENTIAL/PLATELET
Abs Immature Granulocytes: 0.05 10*3/uL (ref 0.00–0.07)
Basophils Absolute: 0 10*3/uL (ref 0.0–0.1)
Basophils Relative: 0 %
Eosinophils Absolute: 0.1 10*3/uL (ref 0.0–0.5)
Eosinophils Relative: 1 %
HCT: 39.7 % (ref 36.0–46.0)
Hemoglobin: 13.8 g/dL (ref 12.0–15.0)
Immature Granulocytes: 1 %
Lymphocytes Relative: 12 %
Lymphs Abs: 1.3 10*3/uL (ref 0.7–4.0)
MCH: 31.5 pg (ref 26.0–34.0)
MCHC: 34.8 g/dL (ref 30.0–36.0)
MCV: 90.6 fL (ref 80.0–100.0)
Monocytes Absolute: 0.6 10*3/uL (ref 0.1–1.0)
Monocytes Relative: 6 %
Neutro Abs: 8.6 10*3/uL — ABNORMAL HIGH (ref 1.7–7.7)
Neutrophils Relative %: 80 %
Platelets: 240 10*3/uL (ref 150–400)
RBC: 4.38 MIL/uL (ref 3.87–5.11)
RDW: 11.4 % — ABNORMAL LOW (ref 11.5–15.5)
WBC: 10.8 10*3/uL — ABNORMAL HIGH (ref 4.0–10.5)
nRBC: 0 % (ref 0.0–0.2)

## 2022-05-30 LAB — URINALYSIS, ROUTINE W REFLEX MICROSCOPIC
Bilirubin Urine: NEGATIVE
Glucose, UA: NEGATIVE mg/dL
Hgb urine dipstick: NEGATIVE
Ketones, ur: NEGATIVE mg/dL
Leukocytes,Ua: NEGATIVE
Nitrite: NEGATIVE
Protein, ur: NEGATIVE mg/dL
Specific Gravity, Urine: 1.006 (ref 1.005–1.030)
pH: 6 (ref 5.0–8.0)

## 2022-05-30 NOTE — Discharge Instructions (Addendum)
You can take 2 Tylenol and 2 ibuprofen together every 6 hours as needed for pain.  It is possible that this is related to the ovarian cyst.  Your urine today did not show any signs of infection and you can stop the Macrobid.  If you start having high fever, vomiting, pain becomes severe return to the emergency room for a CAT scan.

## 2022-05-30 NOTE — ED Provider Notes (Signed)
[facility] EMERGENCY DEPARTMENT Provider Note   CSN: [id number] Arrival date & time: 05/30/22  0354     History  Chief Complaint  Patient presents with   Urinary Frequency / Chills    Daisy Marquez is a 32 y.o. female.  Patient is a 32 year old female with no significant medical history except for tubal ligation who is presenting today with ongoing issues with abdominal pain.  Patient reports the pain first started on the first week of December.  She had a significant pain in her right lower quadrant and then it seemed to move to affect bilateral flanks and upper abdomen.  She followed up with her doctor at that time and they said there was blood in her urine and treated her with Cipro.  She however at that time was having no dysuria, frequency or urgency.  The medication did not work she contacted her doctor and they ordered an ultrasound which she had done over a week ago.  She reports since that time in the last week she has started getting frequency and urgency but denies any dysuria.  She just feels that she needs to urinate all the time and still has pain in the lower abdomen and intermittently in the flanks.  She has not had any known fever, nausea, vomiting or change in her bowel movements.  She is sexually active with only 1 partner and she has been without same partner for the last 10 years.  She has not noticed any vaginal discharge burning or itching.  [facility] and they did tell her that her ultrasound results showed a right ovarian cyst.  Because of her symptoms they started her on Macrobid but she reports her urinary symptoms were not improving.  The history is provided by the patient.       Home Medications Prior to Admission medications   Medication Sig Start Date End Date Taking? Authorizing Provider  ibuprofen (ADVIL,MOTRIN) 600 MG tablet Take 1 tablet (600 mg total) by mouth every 6 (six) hours as needed for mild  pain, moderate pain or cramping. 12/13/17   Cheral Marker, CNM  Prenat-FeAsp-Meth-FA-DHA w/o A (PRENATE PIXIE) 10-0.6-0.4-200 MG CAPS Take 1 tablet by mouth daily. 09/20/17   Roe Coombs, CNM      Allergies    Patient has no known allergies.    Review of Systems   Review of Systems  Physical Exam Updated Vital Signs BP 136/76 (BP Location: Left Arm)   Pulse 63   Temp 97.7 F (36.5 C) (Oral)   Resp 18   SpO2 100%  Physical Exam Vitals and nursing note reviewed.  Constitutional:      General: She is not in acute distress.    Appearance: She is well-developed.  HENT:     Head: Normocephalic and atraumatic.  Eyes:     Pupils: Pupils are equal, round, and reactive to light.  Cardiovascular:     Rate and Rhythm: Normal rate and regular rhythm.     Heart sounds: Normal heart sounds. No murmur heard.    No friction rub.  Pulmonary:     Effort: Pulmonary effort is normal.     Breath sounds: Normal breath sounds. No wheezing or rales.  Abdominal:     General: Bowel sounds are normal. There is no distension.     Palpations: Abdomen is soft.     Tenderness: There is abdominal tenderness in the right lower quadrant, suprapubic  area and left lower quadrant. There is no right CVA tenderness, left CVA tenderness, guarding or rebound.  Musculoskeletal:        General: No tenderness. Normal range of motion.     Comments: No edema  Skin:    General: Skin is warm and dry.     Findings: No rash.  Neurological:     Mental Status: She is alert and oriented to person, place, and time. Mental status is at baseline.     Cranial Nerves: No cranial nerve deficit.  Psychiatric:        Mood and Affect: Mood normal.        Behavior: Behavior normal.     ED Results / Procedures / Treatments   Labs (all labs ordered are listed, but only abnormal results are displayed) Labs Reviewed  CBC WITH DIFFERENTIAL/PLATELET - Abnormal; Notable for the following components:      Result Value    WBC 10.8 (*)    RDW 11.4 (*)    Neutro Abs 8.6 (*)    All other components within normal limits  BASIC METABOLIC PANEL - Abnormal; Notable for the following components:   Glucose, Bld 101 (*)    Calcium 8.8 (*)    All other components within normal limits  URINALYSIS, ROUTINE W REFLEX MICROSCOPIC - Abnormal; Notable for the following components:   Color, Urine STRAW (*)    All other components within normal limits  I-STAT BETA HCG BLOOD, ED ([facility], [facility], [facility] ONLY)    EKG None  Radiology No results found.  Procedures Procedures    Medications Ordered in ED Medications - No data to display  ED Course/ Medical Decision Making/ A&P                           Medical Decision Making  Pt presenting today with a complaint that caries a high risk for morbidity and mortality.  Today with ongoing abdominal pain.  Patient has been following up with her doctor as an outpatient and [facility]  She has had 2 courses of antibiotics now but no improvement in her symptoms.  She also had an ultrasound which she reports said she had a right ovarian cyst.  That report is not available through [facility].  Patient does have lower abdominal pain today.  Concern for possible ovarian pathology, lower suspicion for PID, STI.  Also concern for renal pathology such as kidney stone, UTI.  Patient is not displaying symptoms significant for pyelonephritis.  She does have lower abdominal pain but no rebound or guarding.  Given patient's symptoms have been present for approximately a month low suspicion for appendicitis at this time.  I independently interpreted patient's labs today and hCG is negative, CBC with minimal leukocytosis of 10, BMP within normal limits and UA is negative for acute infection.  Discussed the findings with the patient.  Discussed with her that she does not need to be on Macrobid at this time.  Discussed doing a CAT scan for further evaluation of her abdominal pain.   Discussed risk and benefit of that and at this time patient does not want to proceed with CAT scan but wants to follow-up with her doctor next week and go from there.  Did discuss return precautions with the patient.  At this time could be related to the ovarian cyst.  She will continue Tylenol and ibuprofen as needed for fever.  At this time  she is well-appearing and stable for discharge.  Vital signs are normal.         Final Clinical Impression(s) / ED Diagnoses Final diagnoses:  Lower abdominal pain    Rx / DC Orders ED Discharge Orders     None         Blanchie Dessert, MD 05/30/22 1046

## 2022-05-30 NOTE — ED Triage Notes (Signed)
Patient reports persistent urinary urgency/frequency for several weeks with chills these past several days , completed oral antibiotic treatment with no improvement .

## 2022-05-30 NOTE — ED Provider Triage Note (Signed)
Emergency Medicine Provider Triage Evaluation Note  Daisy Marquez , a 32 y.o. female  was evaluated in triage.  Pt complains of urinary symptoms and rigors tonight.  Patient states that she has been having increased urinary frequency and suprapubic pain over the past several weeks.  She was initially treated with a course of ciprofloxacin but states that this did not really help much.  She went to a [facility] 4 days ago and was prescribed nitrofurantoin.  She has had some mild pain in her bilateral flanks.  No vomiting.  She just does not feel very well overall.  Review of Systems  Positive: Flank pain, suprapubic pain, increased frequency and urgency Negative: Vomiting Physical Exam  BP (!) 154/104   Pulse (!) 107   Temp 98.4 F (36.9 C)   Resp 16   SpO2 100%  Gen:   Awake, no distress   Resp:  Normal effort  MSK:   Moves extremities without difficulty  Other:  Mild tachycardia, no focal abdominal tenderness to palpation  Medical Decision Making  Medically screening exam initiated at 4:29 AM.  Appropriate orders placed.  Pamala Mesick was informed that the remainder of the evaluation will be completed by another provider, this initial triage assessment does not replace that evaluation, and the importance of remaining in the ED until their evaluation is complete.     Carlisle Cater, PA-C 05/30/22 0430

## 2022-08-30 ENCOUNTER — Ambulatory Visit: Payer: Self-pay | Attending: Urology | Admitting: Physical Therapy

## 2022-08-30 NOTE — Therapy (Deleted)
OUTPATIENT PHYSICAL THERAPY FEMALE PELVIC EVALUATION   Patient Name: Daisy Marquez MRN: [id number] DOB:11-03-1990, 32 y.o., female Today's Date: 08/30/2022  END OF SESSION:   Past Medical History:  Diagnosis Date   Anemia    Pregnancy induced hypertension    UTI (lower urinary tract infection)    Past Surgical History:  Procedure Laterality Date   NO PAST SURGERIES     TUBAL LIGATION Bilateral 12/12/2017   Procedure: POST PARTUM TUBAL LIGATION;  Surgeon: Truett Mainland, DO;  Location: [place];  Service: Gynecology;  Laterality: Bilateral;   Patient Active Problem List   Diagnosis Date Noted   Obesity (BMI 30.0-34.9) 10/04/2017   History of pre-eclampsia 07/21/2016    PCP: general  REFERRING PROVIDER: Robley Fries, MD   REFERRING DIAG: UTI symptoms,pelvic pain/urgency and frequency  THERAPY DIAG:  No diagnosis found.  Rationale for Evaluation and Treatment: Rehabilitation  ONSET DATE: ***  SUBJECTIVE:                                                                                                                                                                                           SUBJECTIVE STATEMENT: *** Fluid intake: {Yes/No:304960894}   PAIN:  Are you having pain? {yes/no:20286} NPRS scale: ***/10 Pain location: {pelvic pain location:27098}  Pain type: {type:313116} Pain description: {PAIN DESCRIPTION:21022940}   Aggravating factors: *** Relieving factors: ***  PRECAUTIONS: {Therapy precautions:24002}  WEIGHT BEARING RESTRICTIONS: {Yes ***/No:24003}  FALLS:  Has patient fallen in last 6 months? {fallsyesno:27318}  LIVING ENVIRONMENT: Lives with: {OPRC lives with:25569::"lives with their family"} Lives in: {Lives in:25570} Stairs: {opstairs:27293} Has following equipment at home: {Assistive devices:23999}  OCCUPATION: ***  PLOF: {PLOF:24004}  PATIENT GOALS: ***  PERTINENT HISTORY:  *** Sexual abuse:  {Yes/No:304960894}  BOWEL MOVEMENT: Pain with bowel movement: {yes/no:20286} Type of bowel movement:{PT BM type:27100} Fully empty rectum: {Yes/No:304960894} Leakage: {Yes/No:304960894} Pads: {Yes/No:304960894} Fiber supplement: {Yes/No:304960894}  URINATION: Pain with urination: {yes/no:20286} Fully empty bladder: {Yes/No:304960894} Stream: {PT urination:27102} Urgency: {Yes/No:304960894} Frequency: *** Leakage: {PT leakage:27103} Pads: {Yes/No:304960894}  INTERCOURSE: Pain with intercourse: {pain with intercourse PA:27099} Ability to have vaginal penetration:  {Yes/No:304960894} Climax: *** Marinoff Scale: ***/3  PREGNANCY: Vaginal deliveries *** Tearing {Yes***/No:304960894} C-section deliveries *** Currently pregnant {Yes***/No:304960894}  PROLAPSE: {PT prolapse:27101}   OBJECTIVE:   DIAGNOSTIC FINDINGS:  ***  PATIENT SURVEYS:  {rehab surveys:24030}  PFIQ-7 ***  COGNITION: Overall cognitive status: {cognition:24006}     SENSATION: Light touch: {intact/deficits:24005} Proprioception: {intact/deficits:24005}  MUSCLE LENGTH: Hamstrings: Right *** deg; Left *** deg Thomas test: Right *** deg; Left *** deg  LUMBAR SPECIAL TESTS:  {lumbar special test:25242}  FUNCTIONAL TESTS:  {Functional  tests:24029}  GAIT: Distance walked: *** Assistive device utilized: {Assistive devices:23999} Level of assistance: {Levels of assistance:24026} Comments: ***  POSTURE: {posture:25561}  PELVIC ALIGNMENT:  LUMBARAROM/PROM:  A/PROM A/PROM  eval  Flexion   Extension   Right lateral flexion   Left lateral flexion   Right rotation   Left rotation    (Blank rows = not tested)  LOWER EXTREMITY ROM:  {AROM/PROM:27142} ROM Right eval Left eval  Hip flexion    Hip extension    Hip abduction    Hip adduction    Hip internal rotation    Hip external rotation    Knee flexion    Knee extension    Ankle dorsiflexion    Ankle plantarflexion    Ankle  inversion    Ankle eversion     (Blank rows = not tested)  LOWER EXTREMITY MMT:  MMT Right eval Left eval  Hip flexion    Hip extension    Hip abduction    Hip adduction    Hip internal rotation    Hip external rotation    Knee flexion    Knee extension    Ankle dorsiflexion    Ankle plantarflexion    Ankle inversion    Ankle eversion     PALPATION:   General  ***                External Perineal Exam ***                             Internal Pelvic Floor ***  Patient confirms identification and approves PT to assess internal pelvic floor and treatment {yes/no:20286}  PELVIC MMT:   MMT eval  Vaginal   Internal Anal Sphincter   External Anal Sphincter   Puborectalis   Diastasis Recti   (Blank rows = not tested)        TONE: ***  PROLAPSE: ***  TODAY'S TREATMENT:                                                                                                                              DATE: ***  EVAL ***   PATIENT EDUCATION:  Education details: *** Person educated: {Person educated:25204} Education method: {Education Method:25205} Education comprehension: {Education Comprehension:25206}  HOME EXERCISE PROGRAM: ***  ASSESSMENT:  CLINICAL IMPRESSION: Patient is a *** y.o. *** who was seen today for physical therapy evaluation and treatment for ***.   OBJECTIVE IMPAIRMENTS: {opptimpairments:25111}.   ACTIVITY LIMITATIONS: {activitylimitations:27494}  PARTICIPATION LIMITATIONS: {participationrestrictions:25113}  PERSONAL FACTORS: {Personal factors:25162} are also affecting patient's functional outcome.   REHAB POTENTIAL: {rehabpotential:25112}  CLINICAL DECISION MAKING: {clinical decision making:25114}  EVALUATION COMPLEXITY: {Evaluation complexity:25115}   GOALS: Goals reviewed with patient? {yes/no:20286}  SHORT TERM GOALS: Target date: ***  *** Baseline: Goal status: {GOALSTATUS:25110}  2.  *** Baseline:  Goal status:  {GOALSTATUS:25110}  3.  *** Baseline:  Goal status: {GOALSTATUS:25110}  4.  ***  Baseline:  Goal status: {GOALSTATUS:25110}  5.  *** Baseline:  Goal status: {GOALSTATUS:25110}  6.  *** Baseline:  Goal status: {GOALSTATUS:25110}  LONG TERM GOALS: Target date: ***  *** Baseline:  Goal status: {GOALSTATUS:25110}  2.  *** Baseline:  Goal status: {GOALSTATUS:25110}  3.  *** Baseline:  Goal status: {GOALSTATUS:25110}  4.  *** Baseline:  Goal status: {GOALSTATUS:25110}  5.  *** Baseline:  Goal status: {GOALSTATUS:25110}  6.  *** Baseline:  Goal status: {GOALSTATUS:25110}  PLAN:  PT FREQUENCY: {rehab frequency:25116}  PT DURATION: {rehab duration:25117}  PLANNED INTERVENTIONS: {rehab planned interventions:25118::"Therapeutic exercises","Therapeutic activity","Neuromuscular re-education","Balance training","Gait training","Patient/Family education","Self Care","Joint mobilization"}  PLAN FOR NEXT SESSION: ***   Camillo Flaming Kaysie Michelini, PT 08/30/2022, 7:55 AM

## 2023-12-27 ENCOUNTER — Other Ambulatory Visit (HOSPITAL_COMMUNITY)
Admission: RE | Admit: 2023-12-27 | Discharge: 2023-12-27 | Disposition: A | Discharge status: Home / Self Care | Payer: Self-pay | Source: Ambulatory Visit | Attending: Radiology | Admitting: Radiology

## 2023-12-27 ENCOUNTER — Ambulatory Visit (INDEPENDENT_AMBULATORY_CARE_PROVIDER_SITE_OTHER): Payer: Self-pay | Admitting: Radiology

## 2023-12-27 ENCOUNTER — Encounter: Payer: Self-pay | Admitting: Radiology

## 2023-12-27 VITALS — BP 116/78 | HR 100 | Ht 65.5 in | Wt 205.0 lb

## 2023-12-27 DIAGNOSIS — Z124 Encounter for screening for malignant neoplasm of cervix: Secondary | ICD-10-CM

## 2023-12-27 NOTE — Progress Notes (Signed)
   Daisy Marquez [birth year] [id number]   History:  33 y.o. G4P4 presents as a referral for repeat pap. Insufficient cells at PCP x 2 samples. No history of abnormal paps.   Gynecologic History Patient's last menstrual period was 12/17/2023 (exact date). Period Cycle (Days): 28 Period Duration (Days): 3 Period Pattern: Regular Menstrual Flow: Heavy Menstrual Control: Thin pad, Maxi pad Dysmenorrhea: (!) Moderate Dysmenorrhea Symptoms: Cramping Sexually active: yes Last Pap: 2025 insufficient cells x 2 at PCP  Obstetric History OB History  Gravida Para Term Preterm AB Living  4 4 4  0 0 4  SAB IAB Ectopic Multiple Live Births  0 0 0 0 4    # Outcome Date GA Lbr Len/2nd Weight Sex Type Anes PTL Lv  4 Term 12/12/17 [redacted]w[redacted]d 04:20 / 00:09 6 lb 13 oz (3.09 kg) F Vag-Spont EPI  LIV  3 Term 10/09/16 [redacted]w[redacted]d 05:07 / 00:06 8 lb 8.9 oz (3.88 kg) M VBAC EPI  LIV     Birth Comments: WNL  2 Term 01/22/14 [redacted]w[redacted]d 03:21 / 00:21 6 lb 15 oz (3.147 kg) F Vag-Spont EPI  LIV  1 Term 12/13/12 [redacted]w[redacted]d 15:02 / 00:28 7 lb (3.175 kg) M Vag-Spont EPI  LIV     The following portions of the patient's history were reviewed and updated as appropriate: allergies, current medications, past family history, past medical history, past social history, past surgical history, and problem list.  Review of Systems  All other systems reviewed and are negative.   Past medical history, past surgical history, family history and social history were all reviewed and documented in the EPIC chart.  Exam:  Vitals:   12/27/23 1120  BP: 116/78  Pulse: 100  SpO2: 96%  Weight: 205 lb (93 kg)  Height: 5' 5.5 (1.664 m)   Body mass index is 33.59 kg/m.  Physical Exam Vitals and nursing note reviewed. Exam conducted with a chaperone present.  Constitutional:      Appearance: Normal appearance. She is well-developed.  Pulmonary:     Effort: Pulmonary effort is normal.  Abdominal:     General: Abdomen is flat.      Palpations: Abdomen is soft.  Genitourinary:    General: Normal vulva.     Vagina: No vaginal discharge, erythema, bleeding or lesions.     Cervix: Normal. No discharge, friability, lesion or erythema.     Uterus: Normal.      Adnexa: Right adnexa normal and left adnexa normal.  Neurological:     Mental Status: She is alert.  Psychiatric:        Mood and Affect: Mood normal.        Thought Content: Thought content normal.        Judgment: Judgment normal.      Daisy Marquez, CMA present for exam  Assessment/Plan:   1. Encounter for Papanicolaou smear for cervical cancer screening (Primary) - Cytology - PAP( [facility])  Will contact with results  GINETTE COZIER B WHNP-BC 11:40 AM 12/27/2023

## 2024-01-01 ENCOUNTER — Ambulatory Visit: Payer: Self-pay | Admitting: Radiology

## 2024-01-01 LAB — CYTOLOGY - PAP
Comment: NEGATIVE
Diagnosis: NEGATIVE
High risk HPV: NEGATIVE

## 2024-12-26 ENCOUNTER — Ambulatory Visit: Payer: Self-pay | Admitting: Radiology
